# Patient Record
Sex: Male | Born: 1980 | Race: White | Hispanic: No | Marital: Single | Smoking: Never smoker
Health system: Southern US, Community
[De-identification: ages and names within clinical notes are randomized; demographics above are authoritative.]

## PROBLEM LIST (undated history)

## (undated) DIAGNOSIS — F410 Panic disorder [episodic paroxysmal anxiety] without agoraphobia: Secondary | ICD-10-CM

## (undated) DIAGNOSIS — F419 Anxiety disorder, unspecified: Secondary | ICD-10-CM

## (undated) DIAGNOSIS — F209 Schizophrenia, unspecified: Secondary | ICD-10-CM

## (undated) DIAGNOSIS — F1910 Other psychoactive substance abuse, uncomplicated: Secondary | ICD-10-CM

## (undated) DIAGNOSIS — F1994 Other psychoactive substance use, unspecified with psychoactive substance-induced mood disorder: Secondary | ICD-10-CM

## (undated) HISTORY — PX: TONSILLECTOMY: SUR1361

---

## 2005-06-17 ENCOUNTER — Emergency Department (HOSPITAL_COMMUNITY): Admission: EM | Admit: 2005-06-17 | Discharge: 2005-06-17 | Payer: Self-pay | Admitting: Emergency Medicine

## 2005-08-31 ENCOUNTER — Emergency Department (HOSPITAL_COMMUNITY): Admission: EM | Admit: 2005-08-31 | Discharge: 2005-08-31 | Payer: Self-pay | Admitting: Emergency Medicine

## 2005-09-08 ENCOUNTER — Emergency Department (HOSPITAL_COMMUNITY): Admission: EM | Admit: 2005-09-08 | Discharge: 2005-09-08 | Payer: Self-pay | Admitting: Emergency Medicine

## 2005-12-31 ENCOUNTER — Emergency Department (HOSPITAL_COMMUNITY): Admission: EM | Admit: 2005-12-31 | Discharge: 2005-12-31 | Payer: Self-pay | Admitting: Emergency Medicine

## 2006-05-23 ENCOUNTER — Emergency Department (HOSPITAL_COMMUNITY): Admission: EM | Admit: 2006-05-23 | Discharge: 2006-05-23 | Payer: Self-pay | Admitting: Emergency Medicine

## 2014-11-10 ENCOUNTER — Encounter (HOSPITAL_COMMUNITY): Payer: Self-pay | Admitting: Emergency Medicine

## 2014-11-10 ENCOUNTER — Emergency Department (HOSPITAL_COMMUNITY): Payer: Self-pay

## 2014-11-10 ENCOUNTER — Encounter (HOSPITAL_COMMUNITY): Payer: Self-pay

## 2014-11-10 ENCOUNTER — Emergency Department (HOSPITAL_COMMUNITY)
Admission: EM | Admit: 2014-11-10 | Discharge: 2014-11-12 | Disposition: A | Discharge status: Home / Self Care | Payer: Self-pay | Attending: Emergency Medicine | Admitting: Emergency Medicine

## 2014-11-10 ENCOUNTER — Emergency Department (HOSPITAL_COMMUNITY)
Admission: EM | Admit: 2014-11-10 | Discharge: 2014-11-10 | Disposition: A | Discharge status: Home / Self Care | Payer: Self-pay | Attending: Emergency Medicine | Admitting: Emergency Medicine

## 2014-11-10 DIAGNOSIS — F22 Delusional disorders: Secondary | ICD-10-CM | POA: Insufficient documentation

## 2014-11-10 DIAGNOSIS — F1410 Cocaine abuse, uncomplicated: Secondary | ICD-10-CM | POA: Insufficient documentation

## 2014-11-10 DIAGNOSIS — R21 Rash and other nonspecific skin eruption: Secondary | ICD-10-CM | POA: Insufficient documentation

## 2014-11-10 DIAGNOSIS — F1994 Other psychoactive substance use, unspecified with psychoactive substance-induced mood disorder: Secondary | ICD-10-CM | POA: Diagnosis present

## 2014-11-10 DIAGNOSIS — S0990XA Unspecified injury of head, initial encounter: Secondary | ICD-10-CM | POA: Insufficient documentation

## 2014-11-10 DIAGNOSIS — R000 Tachycardia, unspecified: Secondary | ICD-10-CM | POA: Insufficient documentation

## 2014-11-10 DIAGNOSIS — Y998 Other external cause status: Secondary | ICD-10-CM | POA: Insufficient documentation

## 2014-11-10 DIAGNOSIS — F1510 Other stimulant abuse, uncomplicated: Secondary | ICD-10-CM | POA: Insufficient documentation

## 2014-11-10 DIAGNOSIS — Z872 Personal history of diseases of the skin and subcutaneous tissue: Secondary | ICD-10-CM | POA: Insufficient documentation

## 2014-11-10 DIAGNOSIS — Y9389 Activity, other specified: Secondary | ICD-10-CM | POA: Insufficient documentation

## 2014-11-10 DIAGNOSIS — Y9289 Other specified places as the place of occurrence of the external cause: Secondary | ICD-10-CM | POA: Insufficient documentation

## 2014-11-10 DIAGNOSIS — F419 Anxiety disorder, unspecified: Secondary | ICD-10-CM | POA: Insufficient documentation

## 2014-11-10 DIAGNOSIS — L259 Unspecified contact dermatitis, unspecified cause: Secondary | ICD-10-CM | POA: Insufficient documentation

## 2014-11-10 DIAGNOSIS — X838XXA Intentional self-harm by other specified means, initial encounter: Secondary | ICD-10-CM | POA: Insufficient documentation

## 2014-11-10 DIAGNOSIS — F1210 Cannabis abuse, uncomplicated: Secondary | ICD-10-CM | POA: Insufficient documentation

## 2014-11-10 DIAGNOSIS — F1910 Other psychoactive substance abuse, uncomplicated: Secondary | ICD-10-CM

## 2014-11-10 LAB — COMPREHENSIVE METABOLIC PANEL
ALT: 28 U/L (ref 17–63)
ANION GAP: 11 (ref 5–15)
AST: 37 U/L (ref 15–41)
Albumin: 5.1 g/dL — ABNORMAL HIGH (ref 3.5–5.0)
Alkaline Phosphatase: 50 U/L (ref 38–126)
BUN: 18 mg/dL (ref 6–20)
CALCIUM: 9.9 mg/dL (ref 8.9–10.3)
CO2: 25 mmol/L (ref 22–32)
CREATININE: 1.14 mg/dL (ref 0.61–1.24)
Chloride: 101 mmol/L (ref 101–111)
GFR calc non Af Amer: >60 mL/min (ref >60)
GLUCOSE: 113 mg/dL — AB (ref 70–99)
Potassium: 3.5 mmol/L (ref 3.5–5.1)
SODIUM: 137 mmol/L (ref 135–145)
TOTAL PROTEIN: 8.8 g/dL — AB (ref 6.5–8.1)
Total Bilirubin: 0.9 mg/dL (ref 0.3–1.2)

## 2014-11-10 LAB — CBC WITH DIFFERENTIAL/PLATELET
BASOS PCT: 1 % (ref 0–1)
Basophils Absolute: 0 10*3/uL (ref 0.0–0.1)
EOS ABS: 0.2 10*3/uL (ref 0.0–0.7)
Eosinophils Relative: 2 % (ref 0–5)
HEMATOCRIT: 46.9 % (ref 39.0–52.0)
HEMOGLOBIN: 15.8 g/dL (ref 13.0–17.0)
LYMPHS ABS: 1.6 10*3/uL (ref 0.7–4.0)
Lymphocytes Relative: 22 % (ref 12–46)
MCH: 32.8 pg (ref 26.0–34.0)
MCHC: 33.7 g/dL (ref 30.0–36.0)
MCV: 97.3 fL (ref 78.0–100.0)
MONO ABS: 0.5 10*3/uL (ref 0.1–1.0)
MONOS PCT: 7 % (ref 3–12)
NEUTROS ABS: 5 10*3/uL (ref 1.7–7.7)
Neutrophils Relative %: 68 % (ref 43–77)
Platelets: 273 10*3/uL (ref 150–400)
RBC: 4.82 MIL/uL (ref 4.22–5.81)
RDW: 12.3 % (ref 11.5–15.5)
WBC: 7.3 10*3/uL (ref 4.0–10.5)

## 2014-11-10 LAB — RAPID URINE DRUG SCREEN, HOSP PERFORMED
Amphetamines: POSITIVE — AB
BENZODIAZEPINES: NOT DETECTED
Barbiturates: NOT DETECTED
COCAINE: POSITIVE — AB
OPIATES: NOT DETECTED
Tetrahydrocannabinol: POSITIVE — AB

## 2014-11-10 LAB — ETHANOL: Alcohol, Ethyl (B): <5 mg/dL (ref <5)

## 2014-11-10 LAB — ACETAMINOPHEN LEVEL: Acetaminophen (Tylenol), Serum: <10 ug/mL — ABNORMAL LOW (ref 10–30)

## 2014-11-10 LAB — SALICYLATE LEVEL: Salicylate Lvl: <4 mg/dL (ref 2.8–30.0)

## 2014-11-10 MED ORDER — ONDANSETRON HCL 4 MG PO TABS: 4.0000 mg | ORAL_TABLET | Freq: Three times a day (TID) | ORAL | Status: DC | PRN

## 2014-11-10 MED ORDER — ZOLPIDEM TARTRATE 5 MG PO TABS
5.0000 mg | ORAL_TABLET | Freq: Every evening | ORAL | Status: DC | PRN
  Administered 2014-11-11: 5 mg via ORAL
  Filled 2014-11-10 (×2): qty 1

## 2014-11-10 MED ORDER — DIPHENHYDRAMINE HCL 25 MG PO CAPS
25.0000 mg | ORAL_CAPSULE | Freq: Four times a day (QID) | ORAL | Status: DC
Start: 2014-11-10 — End: 2014-11-12
  Administered 2014-11-10 – 2014-11-12 (×3): 25 mg via ORAL
  Filled 2014-11-10 (×7): qty 1

## 2014-11-10 MED ORDER — ZIPRASIDONE MESYLATE 20 MG IM SOLR
10.0000 mg | Freq: Once | INTRAMUSCULAR | Status: AC
  Administered 2014-11-11: 10 mg via INTRAMUSCULAR
  Filled 2014-11-10: qty 20

## 2014-11-10 MED ORDER — IBUPROFEN 200 MG PO TABS: 600.0000 mg | ORAL_TABLET | Freq: Three times a day (TID) | ORAL | Status: DC | PRN

## 2014-11-10 MED ORDER — ACETAMINOPHEN 325 MG PO TABS
650.0000 mg | ORAL_TABLET | ORAL | Status: DC | PRN
  Filled 2014-11-10: qty 2

## 2014-11-10 MED ORDER — ALUM & MAG HYDROXIDE-SIMETH 200-200-20 MG/5ML PO SUSP: 30.0000 mL | ORAL | Status: DC | PRN

## 2014-11-10 MED ORDER — DIPHENHYDRAMINE HCL 25 MG PO TABS: 25.0000 mg | ORAL_TABLET | Freq: Four times a day (QID) | ORAL | Status: DC

## 2014-11-10 MED ORDER — LORAZEPAM 1 MG PO TABS
1.0000 mg | ORAL_TABLET | Freq: Once | ORAL | Status: DC
  Filled 2014-11-10: qty 1

## 2014-11-10 MED ORDER — HYDROCORTISONE 1 % EX CREA
TOPICAL_CREAM | Freq: Two times a day (BID) | CUTANEOUS | Status: DC
  Administered 2014-11-11: 21:00:00 via TOPICAL
  Filled 2014-11-10: qty 28

## 2014-11-10 MED ORDER — HYDROCORTISONE 1 % EX CREA: TOPICAL_CREAM | CUTANEOUS | Status: DC

## 2014-11-10 MED ORDER — LORAZEPAM 2 MG/ML IJ SOLN
2.0000 mg | Freq: Once | INTRAMUSCULAR | Status: AC
  Administered 2014-11-11: 2 mg via INTRAMUSCULAR
  Filled 2014-11-10: qty 1

## 2014-11-10 MED ORDER — LORAZEPAM 2 MG/ML IJ SOLN: 1.0000 mg | Freq: Once | INTRAMUSCULAR | Status: DC

## 2014-11-10 MED ORDER — LORAZEPAM 1 MG PO TABS
1.0000 mg | ORAL_TABLET | Freq: Three times a day (TID) | ORAL | Status: DC | PRN
  Administered 2014-11-11: 1 mg via ORAL
  Filled 2014-11-10 (×2): qty 1
  Filled 2014-11-10: qty 2

## 2014-11-10 NOTE — ED Notes (Signed)
Patient was witnessed by several nursing staff members of him banging his head on the mantle piece by nursing station and wall. Nurse Aundra MilletMegan, [employer] are in the room with patient now and he has a ice pack on his forehead, and Harriett Sineancy for TTS is also in room

## 2014-11-10 NOTE — BH Assessment (Signed)
Informed by Jacques Navy, that pt is reports SI and HI, and increasing anxiety yet refusing medication. Met with pt to discuss his concerns, and asked him to return to his room as he was expressing thoughts of hurting others. Pt reports he can not return to his room because "there are too many ways I can hurt myself in there." Pt reports he will decline medication. Megan RN informed.    Lear Ng, [facility] Triage Specialist 11/10/2014 9:58 PM

## 2014-11-10 NOTE — ED Notes (Signed)
Patient dressed out.  Security called to wand patient and belongings.

## 2014-11-10 NOTE — ED Notes (Signed)
Patient reports increasing anxiety and feelings of SI. Patient continues to decline offer of medication. Tried to speak with the patient. He reports racing thoughts.  Spoke with Harriett SineNancy, TTS. She will speak with the patient again.  Q 15 safety checks continue.

## 2014-11-10 NOTE — ED Notes (Signed)
Pt states he woke up this morning with an itching rash this morning on his arms that's begun spreading over his body. States it's made him feel very weak and drained.

## 2014-11-10 NOTE — ED Notes (Signed)
[id number] Expected date:  Expected time:  Means of arrival:  Comments: Hold for Shelva MajesticMutz, M

## 2014-11-10 NOTE — ED Notes (Signed)
Patient back in the main ED requesting to speak with [employer].  [employer] notified and talking with patient.

## 2014-11-10 NOTE — ED Notes (Signed)
TTS at bedside. 

## 2014-11-10 NOTE — ED Provider Notes (Signed)
CSN: [id number]     Arrival date & time 11/10/14  1750 History   First MD Initiated Contact with Patient 11/10/14 1843     Chief Complaint  Patient presents with  . Anxiety  . Paranoid     (Consider location/radiation/quality/duration/timing/severity/associated sxs/prior Treatment) HPI Comments: Felecia JanMark Seoane is a 34 y.o. male with a PMHx of ADHD, who presents to the ED with complaints of  sudden onset anxiety and paranoia beginning yesterday.  he states that he feels like he is being followed and talked about , reporting that [facility] he heard people conversing amongst themselves and he felt that they were talking about him. He states there've not been any recent stressors in his life  that could have caused these symptoms, but admits that he tried methamphetamine for the first time one week ago. States he has a positive family history of schizophrenia, bipolar, and major depressive disorder in his father side of the family and his uncles.  aside from ADHD he has no other medical or mental health diagnoses , and he is not currently treated for his ADHD. He is a social drinker, last drink was last week. He denies any auditory or visual hallucinations, suicidal ideations or homicidal ideations. Denies any medical complaints including fevers or chills, chest pain, shortness breath, abdominal pain, nausea, vomiting, diarrhea, dysuria, hematuria, arthralgias, myalgias, numbness, tingling, or weakness. He denies any headache or vision changes. He reports that he has a rash which he had are a been seen for earlier today. He states that he does not feel safe going home due to this anxiety and paranoia.   Patient is a 34 y.o. male presenting with anxiety. The history is provided by the patient. No language interpreter was used.  Anxiety This is a new problem. The current episode started yesterday. The problem occurs constantly. The problem has been unchanged. Associated symptoms  include a rash (seen earlier for dermatitis). Pertinent negatives include no abdominal pain, arthralgias, chest pain, chills, fever, headaches, myalgias, nausea, numbness, visual change, vomiting or weakness. Nothing aggravates the symptoms. He has tried nothing for the symptoms. The treatment provided no relief.    History reviewed. No pertinent past medical history. Past Surgical History  Procedure Laterality Date  . Tonsillectomy     History reviewed. No pertinent family history. History  Substance Use Topics  . Smoking status: Never Smoker   . Smokeless tobacco: Not on file  . Alcohol Use: Yes     Comment: social    Review of Systems  Constitutional: Negative for fever and chills.  Eyes: Negative for visual disturbance.  Respiratory: Negative for shortness of breath.   Cardiovascular: Negative for chest pain.  Gastrointestinal: Negative for nausea, vomiting, abdominal pain, diarrhea and constipation.  Genitourinary: Negative for dysuria and hematuria.  Musculoskeletal: Negative for myalgias and arthralgias.  Skin: Positive for rash (seen earlier for dermatitis).  Allergic/Immunologic: Negative for immunocompromised state.  Neurological: Negative for weakness, numbness and headaches.  Psychiatric/Behavioral: Negative for suicidal ideas, hallucinations and confusion. The patient is nervous/anxious.    10 Systems reviewed and are negative for acute change except as noted in the HPI.    Allergies  Review of patient's allergies indicates no known allergies.  Home Medications   Prior to Admission medications   Medication Sig Start Date End Date Taking? Authorizing Provider  diphenhydrAMINE (BENADRYL) 25 MG tablet Take 1 tablet (25 mg total) by mouth every 6 (six) hours. Patient not taking: Reported on  11/10/2014 11/10/14   Kathrynn Speedobyn M Hess, PA-C  hydrocortisone cream 1 % Apply to affected area 2 times daily Patient not taking: Reported on 11/10/2014 11/10/14   Robyn M Hess, PA-C    BP 150/94 mmHg  Pulse 107  Temp(Src) 98.3 F (36.8 C) (Oral)  Resp 18  SpO2 100% Physical Exam  Constitutional: He is oriented to person, place, and time. He appears well-developed and well-nourished.  Non-toxic appearance. He appears distressed (appears anxious).  Afebrile, nontoxic, appears anxious, mildly tachycardic  HENT:  Head: Normocephalic and atraumatic.  Mouth/Throat: Oropharynx is clear and moist and mucous membranes are normal.  Eyes: Conjunctivae and EOM are normal. Right eye exhibits no discharge. Left eye exhibits no discharge.  Neck: Normal range of motion. Neck supple.  Cardiovascular: Regular rhythm, normal heart sounds and intact distal pulses.  Tachycardia present.  Exam reveals no gallop and no friction rub.   No murmur heard. Mildly tachycardic  Pulmonary/Chest: Effort normal and breath sounds normal. No respiratory distress. He has no decreased breath sounds. He has no wheezes. He has no rhonchi. He has no rales.  Abdominal: Soft. Normal appearance and bowel sounds are normal. He exhibits no distension. There is no tenderness. There is no rigidity, no rebound, no guarding, no CVA tenderness, no tenderness at McBurney's point and negative Murphy's sign.  Musculoskeletal: Normal range of motion.  Neurological: He is alert and oriented to person, place, and time. He has normal strength. No sensory deficit.  Skin: Skin is warm, dry and intact. No rash noted.  Psychiatric: His mood appears anxious. He is not actively hallucinating. Thought content is paranoid. Thought content is not delusional. He expresses no homicidal and no suicidal ideation. He expresses no suicidal plans and no homicidal plans.  Appears anxious, endorsing paranoid thoughts. No AVH, no SI/HI. Speech slightly rapid but goal-oriented speech.   Nursing note and vitals reviewed.   ED Course  Procedures (including critical care time) Labs Review Labs Reviewed  COMPREHENSIVE METABOLIC PANEL -  Abnormal; Notable for the following:    Glucose, Bld 113 (*)    Total Protein 8.8 (*)    Albumin 5.1 (*)    All other components within normal limits  URINE RAPID DRUG SCREEN (HOSP PERFORMED) - Abnormal; Notable for the following:    Cocaine POSITIVE (*)    Amphetamines POSITIVE (*)    Tetrahydrocannabinol POSITIVE (*)    All other components within normal limits  ACETAMINOPHEN LEVEL - Abnormal; Notable for the following:    Acetaminophen (Tylenol), Serum <10 (*)    All other components within normal limits  CBC WITH DIFFERENTIAL/PLATELET  ETHANOL  SALICYLATE LEVEL    Imaging Review No results found.   EKG Interpretation None      MDM   Final diagnoses:  Paranoia  Anxiety  Cocaine abuse  Polysubstance abuse  Head injury    34 y.o. male  here with acute onset anxiety and paranoia after trying methamphetamine last week. He does not drink chronically last drink was last week, no SI/HI/AVH feels that he is being followed and that people are talking about him when he hears people talking around him. D[facility] with [facility]ristin [facility] who feels he warrants formal TTS consult and psych input given his strong FHx of schizophrenia and bipolar. Will get screening labs and consult TTS.   7:57 PM Pt very anxious feeling up front in the triage area. Agrees that if he can go back to the back he'll get labs. Working on getting  pt wanded and changed out, then will get labs to medically clear. Will get TTS consulted now despite not yet having labs. Will give ativan now.  9:09 PM TTS calling me, states that his UDS came back with +cocaine, +amphetamines, +THC, therefore they want to keep him until the morning and have psych reassess him. Pt stable at this time. CBC unremarkable, still awaiting CMP, EtOH, Tylenol level, and salicylate levels.   9:42 PM CMP unremarkable, EtOH level neg, Acetaminophen and salicylate WNL. Pt is medically cleared. Please see [facility]/Psych notes for further documentation  of care. Holding orders placed, home meds ordered.  Taylorann Tkach Camprubi-Soms, PA-C 11/10/14 2143  Addendum 10:24 PM: called to re-evaluate pt, he got up out of bed and banged his head on the hard corner surface of bed. Now having dizziness and headache. Neuro exam nonfocal. Will get CT head. Will IVC pt since he's now a threat to himself. He agrees to take his meds. Then I was told he wasn't taking it after I left, therefore will give geodon and ativan IM. IVC paperwork filled out.   12:48 AM CT still not yet performed, CT aware of his order, stated they are waiting for nursing to tell them he's ready for CT. Will ask nursing staff to get him ready for this. Will sign out care to Elpidio Anis PA-C at shift change to follow up on his CT results. Plan is if no acute injury from banging his head on bed, no further work up needed acutely and will proceed with waiting on psych input in the morning. If any acute bleed/injury, consult neuro as appropriate. Please see her documentation for further notation about care/results.  Janesa Dockery Camprubi-Soms, PA-C 11/11/14 0050  1:09 AM CT neg.   Joeli Fenner Camprubi-Soms, PA-C 11/11/14 0109  Blane Ohara, MD 11/13/14 1106

## 2014-11-10 NOTE — ED Notes (Signed)
Writer called PA to notified that pt would like to speak to her before allowing staff members to draw blood

## 2014-11-10 NOTE — Discharge Instructions (Signed)
Apply hydrocortisone cream twice daily as prescribed. Take Benadryl as directed for itching.  Contact Dermatitis Contact dermatitis is a reaction to certain substances that touch the skin. Contact dermatitis can be either irritant contact dermatitis or allergic contact dermatitis. Irritant contact dermatitis does not require previous exposure to the substance for a reaction to occur.Allergic contact dermatitis only occurs if you have been exposed to the substance before. Upon a repeat exposure, your body reacts to the substance.  CAUSES  Many substances can cause contact dermatitis. Irritant dermatitis is most commonly caused by repeated exposure to mildly irritating substances, such as:  Makeup.  Soaps.  Detergents.  Bleaches.  Acids.  Metal salts, such as nickel. Allergic contact dermatitis is most commonly caused by exposure to:  Poisonous plants.  Chemicals (deodorants, shampoos).  Jewelry.  Latex.  Neomycin in triple antibiotic cream.  Preservatives in products, including clothing. SYMPTOMS  The area of skin that is exposed may develop:  Dryness or flaking.  Redness.  Cracks.  Itching.  Pain or a burning sensation.  Blisters. With allergic contact dermatitis, there may also be swelling in areas such as the eyelids, mouth, or genitals.  DIAGNOSIS  Your caregiver can usually tell what the problem is by doing a physical exam. In cases where the cause is uncertain and an allergic contact dermatitis is suspected, a patch skin test may be performed to help determine the cause of your dermatitis. TREATMENT Treatment includes protecting the skin from further contact with the irritating substance by avoiding that substance if possible. Barrier creams, powders, and gloves may be helpful. Your caregiver may also recommend:  Steroid creams or ointments applied 2 times daily. For best results, soak the rash area in cool water for 20 minutes. Then apply the medicine. Cover  the area with a plastic wrap. You can store the steroid cream in the refrigerator for a "chilly" effect on your rash. That may decrease itching. Oral steroid medicines may be needed in more severe cases.  Antibiotics or antibacterial ointments if a skin infection is present.  Antihistamine lotion or an antihistamine taken by mouth to ease itching.  Lubricants to keep moisture in your skin.  Burow's solution to reduce redness and soreness or to dry a weeping rash. Mix one packet or tablet of solution in 2 cups cool water. Dip a clean washcloth in the mixture, wring it out a bit, and put it on the affected area. Leave the cloth in place for 30 minutes. Do this as often as possible throughout the day.  Taking several cornstarch or baking soda baths daily if the area is too large to cover with a washcloth. Harsh chemicals, such as alkalis or acids, can cause skin damage that is like a burn. You should flush your skin for 15 to 20 minutes with cold water after such an exposure. You should also seek immediate medical care after exposure. Bandages (dressings), antibiotics, and pain medicine may be needed for severely irritated skin.  HOME CARE INSTRUCTIONS  Avoid the substance that caused your reaction.  Keep the area of skin that is affected away from hot water, soap, sunlight, chemicals, acidic substances, or anything else that would irritate your skin.  Do not scratch the rash. Scratching may cause the rash to become infected.  You may take cool baths to help stop the itching.  Only take over-the-counter or prescription medicines as directed by your caregiver.  See your caregiver for follow-up care as directed to make sure your skin is healing  properly. SEEK MEDICAL CARE IF:   Your condition is not better after 3 days of treatment.  You seem to be getting worse.  You see signs of infection such as swelling, tenderness, redness, soreness, or warmth in the affected area.  You have any  problems related to your medicines. Document Released: 06/16/2000 Document Revised: 09/11/2011 Document Reviewed: 11/22/2010 [facility] Patient Information 2015 [place], [place]. This information is not intended to replace advice given to you by your health care provider. Make sure you discuss any questions you have with your health care provider.  Rash A rash is a change in the color or texture of your skin. There are many different types of rashes. You may have other problems that accompany your rash. CAUSES   Infections.  Allergic reactions. This can include allergies to pets or foods.  Certain medicines.  Exposure to certain chemicals, soaps, or cosmetics.  Heat.  Exposure to poisonous plants.  Tumors, both cancerous and noncancerous. SYMPTOMS   Redness.  Scaly skin.  Itchy skin.  Dry or cracked skin.  Bumps.  Blisters.  Pain. DIAGNOSIS  Your caregiver may do a physical exam to determine what type of rash you have. A skin sample (biopsy) may be taken and examined under a microscope. TREATMENT  Treatment depends on the type of rash you have. Your caregiver may prescribe certain medicines. For serious conditions, you may need to see a skin doctor (dermatologist). HOME CARE INSTRUCTIONS   Avoid the substance that caused your rash.  Do not scratch your rash. This can cause infection.  You may take cool baths to help stop itching.  Only take over-the-counter or prescription medicines as directed by your caregiver.  Keep all follow-up appointments as directed by your caregiver. SEEK IMMEDIATE MEDICAL CARE IF:  You have increasing pain, swelling, or redness.  You have a fever.  You have new or severe symptoms.  You have body aches, diarrhea, or vomiting.  Your rash is not better after 3 days. MAKE SURE YOU:  Understand these instructions.  Will watch your condition.  Will get help right away if you are not doing well or get worse. Document Released:  06/09/2002 Document Revised: 09/11/2011 Document Reviewed: 04/03/2011 [facility] Patient Information 2015 [place], [place]. This information is not intended to replace advice given to you by your health care provider. Make sure you discuss any questions you have with your health care provider.

## 2014-11-10 NOTE — ED Notes (Signed)
Removed chair and bedside table from patient's room as he felt "there is too much in my room that I can hurt myself with".

## 2014-11-10 NOTE — BH Assessment (Signed)
Reviewed ED notes prior to initiating assessment. Per France RavensMercedes, PA-C note Pt here earlier for dermatitis. Pt returns stating that he did not say anything earlier b/c he didn't have the nerve. Pt states he feels paranoid. Feels people or watching him and that he constantly has to look around. Denies any hx of paranoia or psych hx. Pt does have ADHD. Lives in a rooming home. Pt states he feels unsafe there. Has no specific reason for paranoia. No new stressors in life. Pt denies SI/HI. Denies hearing voices or visual hallucinations. Pt asked to have room door left open and to check on him.   Assessment to commence shortly.    Clista BernhardtNancy Jeffery Bachmeier, [facility] Triage Specialist 11/10/2014 8:01 PM

## 2014-11-10 NOTE — BH Assessment (Addendum)
Tele Assessment Note  Blood work had not resulted at time of assessment.   Aaron Townsend is an 34 y.o. male. With reported hx of ADHD, but not on medication, arriving to ED due to increased paranoia and anxiety. Pt reports onset of these sx about 4-5 days ago, and "getting worse and worse" today. He reports he came to the hospital because he feels no one is helping him, and he needs to feel "protected" Pt reports he wants to be placed in seclusion, and guarded so he can be properly evaluated. At the time of assessment pt requested an officer stand in front of him to protect him, and pt remained very anxious, he had trouble answering questions as he stood in the doorway of his room, watching everything that took place, and appearing to become more and more anxious. Pt was alert, very anxious, oriented to person, place, and time, but reports he believes there is a conspiracy going on and that he can hear people threatening him. He denies SI, HI, and self-harm. He reports SI about 2-3 years ago when his grandfather passed away, but had no past plans or attempts.   Pt reports family history is positive for bipolar and schizophrenia, and is concerned that this may be happening to him, however, also believes that he is currently in danger. Pt reports he has never had these symptoms before, and has been having constant panic attacks.  Pt reports about two or three months ago he started to feel depressed. He reports he never really had episodes of depression before, however, mentioned SI and depression after loss of grandfather.Pt was unable to describe current depressive sx but noted losing 20 lbs in the last two months and being unable to sleep more the 2-4 hours. He reports depressive episode predated onset of anxiety. Denies SI. Denies hx of mania or hypomania.  Pt reports hx of infrequent panic attacks, and denies hx of OCD, phobia, or PTSD. Denies hx of abuse or trauma. He reports persistent panic and paranoia,  feelings that people are conspiring against him.  Pt reports he began using etoh at age 59 and drinks about 2-3 per month, about 1 beer. "Sometimes I have a good time, but I never take it too far." Pt reports last drink was about 10 days ago. Pt reports about monthly use of THC since age 52, with last use about 3 weeks ago. Pt reports he sometimes uses cocaine but was unable to supply details. He reports last use was several months ago. Pt reports using pain medications starting at age 29 but no use in 4-5 years. Pt reports one time use of methamphetamines about a week ago. Pt tested positive to cocaine, TCH, and Amphetamines.  Pt indicated he was dx with ADHD at age 73 but is not currently being treated for this.   Pt has no prior hx of inpt or OP mental health treatment. He reports he gets along with his roommates, denies trouble at work, or onset of new stressors. He lists his brother as a good support for him. He denies any medical concerns at this time, but was seen in ED in the last day or so for dermatitis.   UPDATE: After assessment and pt moved to secure area he began to expression SI, and HI. He was banging his head and trying to hurt himself. He was placed under IVC and had to be given medication IM.   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 No pertinent past medical history.  Past Surgical History  Procedure Laterality Date  . Tonsillectomy      Family History: History reviewed. No pertinent family history.  Social History:  reports that he has never smoked. He does not have any smokeless tobacco history on file. He reports that he drinks alcohol. He reports that he does not use illicit  drugs.  Additional Social History:  Alcohol / Drug Use Pain Medications: See PTA, denies current abuse reports last use about 4-5 years ago Prescriptions: Denies, See PTA Over the Counter: See PTA History of alcohol / drug use?: Yes Longest period of sobriety (when/how long): 4-5 years for pain medications, months etoh  Negative Consequences of Use:  (none reported) Withdrawal Symptoms:  (none reported, however, very paranoid) Substance #1 Name of Substance 1: methamphetamines 1 - Age of First Use: 33 about a week and a half ago 1 - Amount (size/oz): uncertain 1 - Frequency: one time use reported 1 - Duration: one time use reported 1 - Last Use / Amount: 1.5 weeks ago Substance #2 Name of Substance 2: etoh 2 - Age of First Use: 20 2 - Amount (size/oz): 1-5 drinks, usually one or two 2 - Frequency: about once per month 2 - Duration: years at this level  2 - Last Use / Amount: abotu 1.5 weeks ago a beer Substance #3 Name of Substance 3: THC 3 - Age of First Use: 26 3 - Amount (size/oz): "a little bit" 3 - Frequency: about once a month 3 - Duration: about 7 years 3 - Last Use / Amount: 2.5 to three weeks ago  Substance #4 Name of Substance 4: cocaine 4 - Age of First Use: 28 4 - Amount (size/oz): unable to provide details due to paranoia, distracted and requested officer stand in front of him  4 - Frequency: unable to provide details due to paranoia, distracted and requested officer stand in front of him  4 - Duration: unable to provide details due to paranoia, distracted and requested officer stand in front of him  4 - Last Use / Amount: a few months Substance #5 Name of Substance 5: pain medications 5 - Age of First Use: 21 5 - Amount (size/oz): unable to provide details due to paranoia, distracted and requested officer stand in front of him  5 - Frequency: unable to provide details due to paranoia, distracted and requested officer stand in front of him  5 - Duration: unable  to provide details due to paranoia, distracted and requested officer stand in front of him  5 - Last Use / Amount: 4-5 years ago  CIWA: CIWA-Ar BP: 150/94 mmHg Pulse Rate: 107 COWS:    PATIENT STRENGTHS: (choose at least two) Average or above average intelligence Communication skills Supportive family/friends Work skills  Allergies: No Known Allergies  Home Medications:  (Not in a hospital admission)  OB/GYN Status:  No LMP for male patient.  General Assessment Data Location of Assessment: [facility] TTS Assessment: In system Is this a Tele or Face-to-Face Assessment?: Face-to-Face Is this an Initial Assessment or a Re-assessment for this encounter?: Initial Assessment Marital status: Single Is patient pregnant?: No Pregnancy Status: No Living Arrangements: Non-relatives/Friends Can pt return to current living arrangement?: Yes Admission Status: Voluntary Is patient capable of signing voluntary  admission?: Yes Referral Source: Self/Family/Friend Insurance type: none     Crisis Care Plan Living Arrangements: Non-relatives/Friends Name of Psychiatrist: none Name of Therapist: none  Education Status Is patient currently in school?: No Current Grade: NA Highest grade of school patient has completed: 12 Name of school: NA Contact person: NA  Risk to self with the past 6 months Suicidal Ideation: No Has patient been a risk to self within the past 6 months prior to admission? : No Suicidal Intent: No Has patient had any suicidal intent within the past 6 months prior to admission? : No Is patient at risk for suicide?: No Suicidal Plan?: No Has patient had any suicidal plan within the past 6 months prior to admission? : No Access to Means: No What has been your use of drugs/alcohol within the last 12 months?: Pt reports first use of methamphetamines about a week ago, one time use. He reports infrequent use of cocaine, details unknow, and about monthly use of etoh and  THC Previous Attempts/Gestures: No How many times?: 0 Other Self Harm Risks: none Triggers for Past Attempts: None known Intentional Self Injurious Behavior: None Family Suicide History: No Recent stressful life event(s): Other (Comment) (recent onset of new MH sx) Persecutory voices/beliefs?: Yes Depression: Yes Depression Symptoms: Insomnia, Isolating, Loss of interest in usual pleasures, Fatigue, Despondent Substance abuse history and/or treatment for substance abuse?: No Suicide prevention information given to non-admitted patients: Not applicable  Risk to Others within the past 6 months Homicidal Ideation: No Does patient have any lifetime risk of violence toward others beyond the six months prior to admission? : No Thoughts of Harm to Others: No Current Homicidal Intent: No Current Homicidal Plan: No Access to Homicidal Means: No Identified Victim: none History of harm to others?: No Assessment of Violence: None Noted Violent Behavior Description: none Does patient have access to weapons?: No Criminal Charges Pending?: No Does patient have a court date: No Is patient on probation?: No  Psychosis Hallucinations: Auditory (making threats) Delusions: Persecutory  Mental Status Report Appearance/Hygiene: In scrubs Eye Contact: Poor Motor Activity: Restlessness Speech: Logical/coherent Level of Consciousness: Alert Mood: Anxious, Fearful, Preoccupied Affect: Apprehensive Anxiety Level: Panic Attacks Panic attack frequency: ongoing for past 4-5 days, in the past very rarely per pt Most recent panic attack: today  Thought Processes:  (influenced by delusions) Judgement: Impaired Orientation: Person, Place, Time (appears to be delusional about situation) Obsessive Compulsive Thoughts/Behaviors: None  Cognitive Functioning Concentration: Decreased Memory: Recent Intact, Remote Intact IQ: Average Insight: Poor Impulse Control: Fair Appetite: Poor Weight Loss: 20  (in two months) Weight Gain: 0 Sleep: Decreased Total Hours of Sleep: 3 (2-4 hours per pt ) Vegetative Symptoms: None  ADLScreening [facility] Assessment [facility]) Patient's cognitive ability adequate to safely complete daily activities?: Yes Patient able to express need for assistance with ADLs?: Yes Independently performs ADLs?: Yes (appropriate for developmental age)  Prior Inpatient Therapy Prior Inpatient Therapy: No Prior Therapy Dates: NA Prior Therapy Facilty/Provider(s): NA Reason for Treatment: NA  Prior Outpatient Therapy Prior Outpatient Therapy: No Prior Therapy Dates: NA Prior Therapy Facilty/Provider(s): NA Reason for Treatment: NA Does patient have an ACCT team?: No Does patient have Intensive In-House Services?  : No Does patient have [facility] services? : No Does patient have P4CC services?: No  ADL Screening (condition at time of admission) Patient's cognitive ability adequate to safely complete daily activities?: Yes Is the patient deaf or have difficulty hearing?: No Does the patient have difficulty seeing, even when wearing  glasses/contacts?: No Does the patient have difficulty concentrating, remembering, or making decisions?: Yes Patient able to express need for assistance with ADLs?: Yes Does the patient have difficulty dressing or bathing?: No Independently performs ADLs?: Yes (appropriate for developmental age) Does the patient have difficulty walking or climbing stairs?: No Weakness of Legs: None Weakness of Arms/Hands: None  Home Assistive Devices/Equipment Home Assistive Devices/Equipment: None    Abuse/Neglect Assessment (Assessment to be complete while patient is alone) Physical Abuse: Denies Verbal Abuse: Denies Sexual Abuse: Denies Exploitation of patient/patient's resources: Denies Self-Neglect: Denies Values / Beliefs Cultural Requests During Hospitalization: None Spiritual Requests During Hospitalization: None   Advance Directives (For  Healthcare) Does patient have an advance directive?: No Would patient like information on creating an advanced directive?: No - patient declined information    Additional Information 1:1 In Past 12 Months?: No CIRT Risk: No Elopement Risk: No Does patient have medical clearance?: No     Disposition:  Per Donell SievertSpencer Simon, PA pt needs an AM psych eval for final disposition.  Informed UGI CorporationMercedes PA-C, and she is in agreement. Informed RN and pt of disposition.        Clista BernhardtNancy Tanylah Schnoebelen, [facility] Triage Specialist 11/10/2014 8:52 PM  Disposition Initial Assessment Completed for this Encounter: Yes  Warner Laduca M 11/10/2014 8:37 PM

## 2014-11-10 NOTE — ED Notes (Signed)
Pt here earlier for dermatitis.  Pt returns stating that he did not say anything earlier b/c he didn't have the nerve.  Pt states he feels paranoid. Feels people or watching him and that he constantly has to look around.  Denies any hx of paranoia or psych hx.  Pt does have ADHD.  Lives in a rooming home.  Pt states he feels unsafe there.  Has no specific reason for paranoia. No new stressors in life.  Pt denies SI/HI.  Denies hearing voices or visual hallucinations.  Pt asked to have room door left open and to check on him

## 2014-11-10 NOTE — ED Notes (Signed)
Labs delayed because pt wants to speak to the PA

## 2014-11-10 NOTE — ED Notes (Signed)
Unable to collect patient labs at this time.  He would like to speak to PA first.

## 2014-11-10 NOTE — ED Notes (Signed)
Patient anxious, paranoid. Reports SI and AH. States "I don't know" when asked about feelings of HI. Patient does not want to take Ativan or Ambien at present as he is fearful of becoming sleepy. States that he feels safe with an Technical sales engineerofficer at bedside.   Given Benadryl. Officer with patient at present.  Q 15 safety checks in place.

## 2014-11-10 NOTE — ED Provider Notes (Signed)
CSN: [id number]     Arrival date & time 11/10/14  1320 History  This chart was scribed for non-physician practitioner, Celene Skeenobyn [employer], PA-C,working with Aaron BarretteMarcy Pfeiffer, MD, by Aaron Townsend, ED Scribe. This patient was seen in room WTR6/WTR6 and the patient's care was started at 2:18 PM.  Chief Complaint  Patient presents with  . Rash   Patient is a 34 y.o. male presenting with rash. The history is provided by the patient and medical records. No language interpreter was used.  Rash Associated symptoms: no fever, no nausea, no shortness of breath and not vomiting     HPI Comments:  Aaron Townsend is a 34 y.o. male who presents to the Emergency Department complaining of an itching rash that began this morning upon waking. He states the rash now seems to be spreading all over his body to his back and ankles. He denies any new soaps, detergents, lotions, creams or any other personal hygiene products. He denies sleeping in a hotel, moving recently or being outside at all. He is unaware of anything that may have bitten him. Denies contact with anyone with similar symptoms.  Denies having any pets. He has not done anything to treat the rash. He denies modifying factors. Denies SOB, difficulty swallowing, fever, chills, nausea or vomiting.   History reviewed. No pertinent past medical history. Past Surgical History  Procedure Laterality Date  . Tonsillectomy     History reviewed. No pertinent family history. History  Substance Use Topics  . Smoking status: Not on file  . Smokeless tobacco: Not on file  . Alcohol Use: Not on file    Review of Systems  Constitutional: Negative for fever and chills.  HENT: Negative for trouble swallowing.   Respiratory: Negative for shortness of breath.   Gastrointestinal: Negative for nausea and vomiting.  Skin: Positive for rash.    Allergies  Review of patient's allergies indicates no known allergies.  Home Medications   Prior to Admission medications    Medication Sig Start Date End Date Taking? Authorizing Provider  diphenhydrAMINE (BENADRYL) 25 MG tablet Take 1 tablet (25 mg total) by mouth every 6 (six) hours. 11/10/14   Aaron Speedobyn M Jalesia Loudenslager, PA-C  hydrocortisone cream 1 % Apply to affected area 2 times daily 11/10/14   Aaron Speedobyn M Sheilah Rayos, PA-C   Triage Vitals: BP 132/86 mmHg  Pulse 102  Temp(Src) 98.5 F (36.9 C) (Oral)  Resp 20  SpO2 95% Physical Exam  Constitutional: He is oriented to person, place, and time. He appears well-developed and well-nourished. No distress.  HENT:  Head: Normocephalic and atraumatic.  Eyes: Conjunctivae and EOM are normal.  Neck: Normal range of motion. Neck supple.  Cardiovascular: Normal rate, regular rhythm and normal heart sounds.   Pulmonary/Chest: Effort normal and breath sounds normal.  Musculoskeletal: Normal range of motion. He exhibits no edema.  Neurological: He is alert and oriented to person, place, and time.  Skin: Skin is warm and dry. Rash noted.  Maculopapular rash on right upper arm. No secondary infection. Scattered areas of maculopapular rash on bilateral forearms, lower legs and neck. No secondary infection.  Psychiatric: He has a normal mood and affect. His behavior is normal.  Nursing note and vitals reviewed.   ED Course  Procedures (including critical care time) DIAGNOSTIC STUDIES: Oxygen Saturation is 95% on RA, adequate by my interpretation.   COORDINATION OF CARE: 2:21 PM- Will prescribe Hydrocortisone Cream. Pt verbalizes understanding and agrees to plan.  Medications - No data to display  Labs Review Labs Reviewed - No data to display  Imaging Review No results found.   EKG Interpretation None      MDM   Final diagnoses:  Contact dermatitis  Rash   Nontoxic appearing, NAD. AF VSS. No tachycardia on my exam. No respiratory or airway compromise. Rashes appearance of contact dermatitis. No secondary infection. Treat with hydrocortisone cream and Benadryl. Stable for  discharge. Return precautions given. Resources given for f/u. Patient states understanding of treatment care plan and is agreeable.  I personally performed the services described in this documentation, which was scribed in my presence. The recorded information has been reviewed and is accurate.    Aaron SpeedRobyn M Bolivar Koranda, PA-C 11/10/14 1430  Aaron BarretteMarcy Pfeiffer, MD 11/11/14 [phone]

## 2014-11-11 ENCOUNTER — Emergency Department (HOSPITAL_COMMUNITY): Payer: Self-pay

## 2014-11-11 DIAGNOSIS — F1410 Cocaine abuse, uncomplicated: Secondary | ICD-10-CM | POA: Diagnosis present

## 2014-11-11 DIAGNOSIS — F1910 Other psychoactive substance abuse, uncomplicated: Secondary | ICD-10-CM | POA: Diagnosis present

## 2014-11-11 DIAGNOSIS — F1510 Other stimulant abuse, uncomplicated: Secondary | ICD-10-CM | POA: Diagnosis present

## 2014-11-11 DIAGNOSIS — F1994 Other psychoactive substance use, unspecified with psychoactive substance-induced mood disorder: Secondary | ICD-10-CM

## 2014-11-11 NOTE — ED Notes (Signed)
Radiology informed that patient is now able to be taken for CT.

## 2014-11-11 NOTE — ED Notes (Signed)
C/o increased anxiety.  Requested and received ativan 1mg .  Will reassess  q 30 minutes.

## 2014-11-11 NOTE — ED Notes (Signed)
Patient tired. Remains anxious. Rates anxiety 7/10. Denies SI, HI, AVH. [facility] Reminded patient about his actions and treatment on 11/10/14.   Encouragement offered. Given Benadryl, Hydrocortisone cream, Ambien. Snack provided.  Q 15 safety checks continue.

## 2014-11-11 NOTE — ED Notes (Signed)
Patient continues to refuse PO medications.

## 2014-11-11 NOTE — ED Notes (Signed)
Patient was sleeping unable to obtain vitals will let day shift know

## 2014-11-11 NOTE — ED Notes (Signed)
Patient continues to struggle and try to get out of the bed. Patient is unstable. Continues to say he wants to kill himself. Thrashing in the bed, attempting self harm. 4 point restraints in place. Press photographerCharge nurse and security at bedside.

## 2014-11-11 NOTE — Consult Note (Signed)
[facility] Consult   Reason for Consult:  Paranoia Referring Physician:  EDP Patient Identification: Aaron Townsend MRN:  [id number] Principal Diagnosis: Substance induced mood disorder Diagnosis:   Patient Active Problem List   Diagnosis Date Noted  . Polysubstance abuse [F19.10] 11/11/2014    Priority: High  . Cocaine abuse [F14.10] 11/11/2014    Priority: High  . Amphetamine abuse [F15.10] 11/11/2014    Priority: High  . Substance induced mood disorder [F19.94] 11/11/2014    Priority: High    Total Time spent with patient: 45 minutes  Subjective:   Aaron Townsend is a 34 y.o. male patient will be re-evaluated in the am.  HPI:  The patient had been using amphetamines and cocaine prior to admission when he started having paranoia that someone was following him and voices.  He was given PRN antipsychotic medications and is not having these symptoms on assessment today.  Aaron Townsend reports feeling tired with a headache.  Denies homicidal/suicidal ideations,hallucinations, and alcohol abuse.  He will be re-evaluated in the am for stability. HPI Elements:   Location:  generalized. Quality:  acute. Severity:  moderate. Timing:  constant. Duration:  day. Context:  drug use.  Past Medical History: History reviewed. No pertinent past medical history.  Past Surgical History  Procedure Laterality Date  . Tonsillectomy     Family History: History reviewed. No pertinent family history. Social History:  History  Alcohol Use  . Yes    Comment: social     History  Drug Use No    History   Social History  . Marital Status: Single    Spouse Name: N/A  . Number of Children: N/A  . Years of Education: N/A   Social History Main Topics  . Smoking status: Never Smoker   . Smokeless tobacco: Not on file  . Alcohol Use: Yes     Comment: social  . Drug Use: No  . Sexual Activity: Not on file   Other Topics Concern  . None   Social History Narrative   Additional Social  History:    Pain Medications: See PTA, denies current abuse reports last use about 4-5 years ago Prescriptions: Denies, See PTA Over the Counter: See PTA History of alcohol / drug use?: Yes Longest period of sobriety (when/how long): 4-5 years for pain medications, months etoh  Negative Consequences of Use:  (none reported) Withdrawal Symptoms:  (none reported, however, very paranoid) Name of Substance 1: methamphetamines 1 - Age of First Use: 24 about a week and a half ago 1 - Amount (size/oz): uncertain 1 - Frequency: one time use reported 1 - Duration: one time use reported 1 - Last Use / Amount: 1.5 weeks ago Name of Substance 2: etoh 2 - Age of First Use: 20 2 - Amount (size/oz): 1-5 drinks, usually one or two 2 - Frequency: about once per month 2 - Duration: years at this level  2 - Last Use / Amount: abotu 1.5 weeks ago a beer Name of Substance 3: THC 3 - Age of First Use: 26 3 - Amount (size/oz): "a little bit" 3 - Frequency: about once a month 3 - Duration: about 7 years 3 - Last Use / Amount: 2.5 to three weeks ago  Name of Substance 4: cocaine 4 - Age of First Use: 28 4 - Amount (size/oz): unable to provide details due to paranoia, distracted and requested officer stand in front of him  4 - Frequency: unable to provide details due to paranoia,  distracted and requested officer stand in front of him  4 - Duration: unable to provide details due to paranoia, distracted and requested officer stand in front of him  4 - Last Use / Amount: a few months Name of Substance 5: pain medications 5 - Age of First Use: 21 5 - Amount (size/oz): unable to provide details due to paranoia, distracted and requested officer stand in front of him  5 - Frequency: unable to provide details due to paranoia, distracted and requested officer stand in front of him  5 - Duration: unable to provide details due to paranoia, distracted and requested officer stand in front of him  5 - Last Use /  Amount: 4-5 years ago           Allergies:  No Known Allergies  Labs:  Results for orders placed or performed during the hospital encounter of 11/10/14 (from the past 48 hour(s))  Drug screen panel, emergency     Status: Abnormal   Collection Time: 11/10/14  8:12 PM  Result Value Ref Range   Opiates NONE DETECTED NONE DETECTED   Cocaine POSITIVE (A) NONE DETECTED   Benzodiazepines NONE DETECTED NONE DETECTED   Amphetamines POSITIVE (A) NONE DETECTED   Tetrahydrocannabinol POSITIVE (A) NONE DETECTED   Barbiturates NONE DETECTED NONE DETECTED    Comment:        DRUG SCREEN FOR MEDICAL PURPOSES ONLY.  IF CONFIRMATION IS NEEDED FOR ANY PURPOSE, NOTIFY LAB WITHIN 5 DAYS.        LOWEST DETECTABLE LIMITS FOR URINE DRUG SCREEN Drug Class       Cutoff (ng/mL) Amphetamine      1000 Barbiturate      200 Benzodiazepine   009 Tricyclics       233 Opiates          300 Cocaine          300 THC              50   CBC WITH DIFFERENTIAL     Status: None   Collection Time: 11/10/14  8:52 PM  Result Value Ref Range   WBC 7.3 4.0 - 10.5 K/uL   RBC 4.82 4.22 - 5.81 MIL/uL   Hemoglobin 15.8 13.0 - 17.0 g/dL   HCT 46.9 39.0 - 52.0 %   MCV 97.3 78.0 - 100.0 fL   MCH 32.8 26.0 - 34.0 pg   MCHC 33.7 30.0 - 36.0 g/dL   RDW 12.3 11.5 - 15.5 %   Platelets 273 150 - 400 K/uL   Neutrophils Relative % 68 43 - 77 %   Neutro Abs 5.0 1.7 - 7.7 K/uL   Lymphocytes Relative 22 12 - 46 %   Lymphs Abs 1.6 0.7 - 4.0 K/uL   Monocytes Relative 7 3 - 12 %   Monocytes Absolute 0.5 0.1 - 1.0 K/uL   Eosinophils Relative 2 0 - 5 %   Eosinophils Absolute 0.2 0.0 - 0.7 K/uL   Basophils Relative 1 0 - 1 %   Basophils Absolute 0.0 0.0 - 0.1 K/uL  Comprehensive metabolic panel     Status: Abnormal   Collection Time: 11/10/14  8:52 PM  Result Value Ref Range   Sodium 137 135 - 145 mmol/L   Potassium 3.5 3.5 - 5.1 mmol/L   Chloride 101 101 - 111 mmol/L   CO2 25 22 - 32 mmol/L   Glucose, Bld 113 (H) 70 - 99  mg/dL   BUN 18 6 -  20 mg/dL   Creatinine, Ser 1.14 0.61 - 1.24 mg/dL   Calcium 9.9 8.9 - 10.3 mg/dL   Total Protein 8.8 (H) 6.5 - 8.1 g/dL   Albumin 5.1 (H) 3.5 - 5.0 g/dL   AST 37 15 - 41 U/L   ALT 28 17 - 63 U/L   Alkaline Phosphatase 50 38 - 126 U/L   Total Bilirubin 0.9 0.3 - 1.2 mg/dL   GFR calc non Af Amer >60 >60 mL/min   GFR calc Af Amer >60 >60 mL/min    Comment: (NOTE) The eGFR has been calculated using the CKD EPI equation. This calculation has not been validated in all clinical situations. eGFR's persistently <60 mL/min signify possible Chronic Kidney Disease.    Anion gap 11 5 - 15  Ethanol     Status: None   Collection Time: 11/10/14  8:52 PM  Result Value Ref Range   Alcohol, Ethyl (B) <5 <5 mg/dL    Comment:        LOWEST DETECTABLE LIMIT FOR SERUM ALCOHOL IS 11 mg/dL FOR MEDICAL PURPOSES ONLY   Acetaminophen level     Status: Abnormal   Collection Time: 11/10/14  8:52 PM  Result Value Ref Range   Acetaminophen (Tylenol), Serum <10 (L) 10 - 30 ug/mL    Comment:        THERAPEUTIC CONCENTRATIONS VARY SIGNIFICANTLY. A RANGE OF 10-30 ug/mL MAY BE AN EFFECTIVE CONCENTRATION FOR MANY PATIENTS. HOWEVER, SOME ARE BEST TREATED AT CONCENTRATIONS OUTSIDE THIS RANGE. ACETAMINOPHEN CONCENTRATIONS >150 ug/mL AT 4 HOURS AFTER INGESTION AND >50 ug/mL AT 12 HOURS AFTER INGESTION ARE OFTEN ASSOCIATED WITH TOXIC REACTIONS.   Salicylate level     Status: None   Collection Time: 11/10/14  8:52 PM  Result Value Ref Range   Salicylate Lvl <3.8 2.8 - 30.0 mg/dL    Vitals: Blood pressure 113/58, pulse 73, temperature 97.7 F (36.5 C), temperature source Oral, resp. rate 20, SpO2 100 %.  Risk to Self: Suicidal Ideation: No Suicidal Intent: No Is patient at risk for suicide?: No Suicidal Plan?: No Access to Means: No What has been your use of drugs/alcohol within the last 12 months?: Pt reports first use of methamphetamines about a week ago, one time use. He reports  infrequent use of cocaine, details unknow, and about monthly use of etoh and THC How many times?: 0 Other Self Harm Risks: none Triggers for Past Attempts: None known Intentional Self Injurious Behavior: None Risk to Others: Homicidal Ideation: No Thoughts of Harm to Others: No Current Homicidal Intent: No Current Homicidal Plan: No Access to Homicidal Means: No Identified Victim: none History of harm to others?: No Assessment of Violence: None Noted Violent Behavior Description: none Does patient have access to weapons?: No Criminal Charges Pending?: No Does patient have a court date: No Prior Inpatient Therapy: Prior Inpatient Therapy: No Prior Therapy Dates: NA Prior Therapy Facilty/Provider(s): NA Reason for Treatment: NA Prior Outpatient Therapy: Prior Outpatient Therapy: No Prior Therapy Dates: NA Prior Therapy Facilty/Provider(s): NA Reason for Treatment: NA Does patient have an ACCT team?: No Does patient have Intensive In-House Services?  : No Does patient have [facility] services? : No Does patient have [facility] services?: No  Current Facility-Administered Medications  Medication Dose Route Frequency Provider Last Rate Last Dose  . acetaminophen (TYLENOL) tablet 650 mg  650 mg Oral Q4H PRN Mercedes Camprubi-Soms, PA-C      . alum & mag hydroxide-simeth (MAALOX/MYLANTA) 200-200-20 MG/5ML suspension 30 mL  30 mL  Oral PRN Mercedes Camprubi-Soms, PA-C      . diphenhydrAMINE (BENADRYL) capsule 25 mg  25 mg Oral Q6H Mercedes Camprubi-Soms, PA-C   25 mg at 11/10/14 2047  . hydrocortisone cream 1 %   Topical BID Mercedes Camprubi-Soms, PA-C      . ibuprofen (ADVIL,MOTRIN) tablet 600 mg  600 mg Oral Q8H PRN Mercedes Camprubi-Soms, PA-C      . LORazepam (ATIVAN) tablet 1 mg  1 mg Oral Q8H PRN Mercedes Camprubi-Soms, PA-C      . ondansetron (ZOFRAN) tablet 4 mg  4 mg Oral Q8H PRN Mercedes Camprubi-Soms, PA-C      . zolpidem (AMBIEN) tablet 5 mg  5 mg Oral QHS PRN Mercedes  Camprubi-Soms, PA-C       Current Outpatient Prescriptions  Medication Sig Dispense Refill  . diphenhydrAMINE (BENADRYL) 25 MG tablet Take 1 tablet (25 mg total) by mouth every 6 (six) hours. (Patient not taking: Reported on 11/10/2014) 20 tablet 0  . hydrocortisone cream 1 % Apply to affected area 2 times daily (Patient not taking: Reported on 11/10/2014) 15 g 0    Musculoskeletal: Strength & Muscle Tone: within normal limits Gait & Station: normal Patient leans: N/A  Psychiatric Specialty Exam:     Blood pressure 113/58, pulse 73, temperature 97.7 F (36.5 C), temperature source Oral, resp. rate 20, SpO2 100 %.There is no height or weight on file to calculate BMI.  General Appearance: Disheveled  Eye Sport and exercise psychologist::  Fair  Speech:  Normal Rate  Volume:  Normal  Mood:  calm, cooperative, sleepy  Affect:  Congruent  Thought Process:  Coherent  Orientation:  Full (Time, Place, and Person)  Thought Content:  WDL  Suicidal Thoughts:  No  Homicidal Thoughts:  No  Memory:  Immediate;   Fair Recent;   Fair Remote;   Fair  Judgement:  Impaired  Insight:  Fair  Psychomotor Activity:  Decreased  Concentration:  Fair  Recall:  [employer] of Knowledge:Fair  Language: Good  Akathisia:  No  Handed:  Right  AIMS (if indicated):     Assets:  Leisure Time Physical Health Resilience Social Support  ADL's:  Intact  Cognition: WNL  Sleep:      Medical Decision Making: Review of Psycho-Social Stressors (1), Review or order clinical lab tests (1) and Review of Medication Regimen & Side Effects (2)  Treatment Plan Summary: Daily contact with patient to assess and evaluate symptoms and progress in treatment, Medication management and Plan re-evaluate in the am  Plan:  Supportive therapy provided about ongoing stressors. Disposition: Re-evaluate in the am for stability  Waylan Boga, PMH-NP 11/11/2014 2:36 PM Patient seen face-to-face for psychiatric evaluation, chart reviewed and case  discussed with the physician extender and developed treatment plan. Reviewed the information documented and agree with the treatment plan. Corena Pilgrim, MD

## 2014-11-11 NOTE — ED Notes (Signed)
Patient continues to try and harm himself by attempting to hit his head. Patient encouraged to stay in bed. Bed moved from wall. [employer] and writer remain in the room with patient.

## 2014-11-11 NOTE — ED Notes (Signed)
Charge nurse went with patient for CT.

## 2014-11-11 NOTE — ED Notes (Signed)
Unable to complete nursing and physical assessment.  Patient is resting quietly with eyes closed. Respirations are even and unlabored.

## 2014-11-12 DIAGNOSIS — F419 Anxiety disorder, unspecified: Secondary | ICD-10-CM | POA: Insufficient documentation

## 2014-11-12 NOTE — [facility] Suicide Risk Assessment (Cosign Needed)
Suicide Risk Assessment  Discharge Assessment   [facility]   Demographic Factors:  Male, Adolescent or young adult, Caucasian and Low socioeconomic status  Total Time spent with patient: 20 minutes  Musculoskeletal: Strength & Muscle Tone: within normal limits Gait & Station: normal Patient leans: N/A  Psychiatric Specialty Exam:     Blood pressure 99/54, pulse 83, temperature 99.4 F (37.4 Townsend), temperature source Oral, resp. rate 18, SpO2 100 %.There is no height or weight on file to calculate BMI.  General Appearance: Casual and Disheveled  Eye Contact::  Good  Speech:  Clear and Coherent and Normal Rate409  Volume:  Normal  Mood:  Euthymic  Affect:  Congruent  Thought Process:  Coherent, Goal Directed and Intact  Orientation:  Full (Time, Place, and Person)  Thought Content:  WDL  Suicidal Thoughts:  No  Homicidal Thoughts:  No  Memory:  Immediate;   Good Recent;   Good Remote;   Good  Judgement:  Good  Insight:  Good  Psychomotor Activity:  Normal  Concentration:  Good  Recall:  NA  Fund of Knowledge:Good  Language: Good  Akathisia:  NA  Handed:  Right  AIMS (if indicated):     Assets:  Desire for Improvement  Sleep:     Cognition: WNL  ADL's:  Impaired      Has this patient used any form of tobacco in the last 30 days? (Cigarettes, Smokeless Tobacco, Cigars, and/or Pipes) Yes, A prescription for an FDA-approved tobacco cessation medication was offered at discharge and the patient refused  Mental Status Per Nursing Assessment::   On Admission:     Current Mental Status by Physician: NA  Loss Factors: NA  Historical Factors: NA  Risk Reduction Factors:   Sense of responsibility to family, Religious beliefs about death, Employed and Living with another person, especially a relative  Continued Clinical Symptoms:  Alcohol/Substance Abuse/Dependencies  Cognitive Features That Contribute To Risk:  Polarized thinking     Suicide Risk:  Minimal: No identifiable suicidal ideation.  Patients presenting with no risk factors but with morbid ruminations; may be classified as minimal risk based on the severity of the depressive symptoms  Principal Problem: Substance induced mood disorder Discharge Diagnoses:  Patient Active Problem List   Diagnosis Date Noted  . Anxiety [F41.9]   . Polysubstance abuse [F19.10] 11/11/2014  . Cocaine abuse [F14.10] 11/11/2014  . 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 Substance induced mood disorder [F19.94] 11/11/2014      Plan Of Care/Follow-up recommendations:  Activity:  as tolerated Diet:  regular  Is patient on multiple antipsychotic therapies at discharge:  No   Has Patient had three or more failed trials of antipsychotic monotherapy by history:  No  Recommended Plan for Multiple Antipsychotic Therapies: NA    Aaron Townsend   PMHNP-BC 11/12/2014, 2:10 PM

## 2014-11-12 NOTE — ED Notes (Signed)
Acuity low.  Patient sleeping.

## 2014-11-12 NOTE — ED Notes (Signed)
Patient discharged home per MD order.  He denies SI/HI/AVH.  Patient will take the bus for transportation home.  He plans to return to work this afternoon.  Patient's valuables were returned and patient signed for same.

## 2014-11-12 NOTE — Discharge Instructions (Addendum)
For your ongoing mental health needs, you are advised to follow up with [facility].  New and returning patients are seen at their walk-in clinic.  Walk-in hours are Monday - Friday from 8:00 am - 3:00 pm.  Walk-in patients are seen on a first come, first served basis.  Try to arrive as early as possible for he best chance of being seen the same day:       [facility]      [address]      [place], [place] [address]      [phone]  To help you maintain a sober lifestyle, a substance abuse treatment program may be helpful to you.  Contact [facility] at your earliest opportunity to see about enrolling in their program:       [facility]      [address]      [place], [place] [address]      [phone]      New patients are seen at the walk-in clinic every Tuesday from 9:00 am - 12:00 pm.

## 2014-11-12 NOTE — Consult Note (Signed)
[facility] Consult   Reason for Consult:  Paranoia Referring Physician:  EDP Patient Identification: Aaron Townsend MRN:  [id number] Principal Diagnosis: Substance induced mood disorder Diagnosis:   Patient Active Problem List   Diagnosis Date Noted  . Polysubstance abuse [F19.10] 11/11/2014  . Cocaine abuse [F14.10] 11/11/2014  . 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 Substance induced mood disorder [F19.94] 11/11/2014    Total Time spent with patient: 45 minutes  Subjective:   Aaron Townsend is a 34 y.o. male patient will be re-evaluated in the am.  HPI:  The patient had been using amphetamines and cocaine prior to admission when he started having paranoia that someone was following him and voices.  He was given PRN antipsychotic medications and is not having these symptoms on assessment today.  Aaron Townsend reports feeling tired with a headache.  Denies homicidal/suicidal ideations,hallucinations, and alcohol abuse.  He will be re-evaluated in the am for stability.  Reviewed above note with updates.  Patient is calm, awake and alert today.  He stated that he believes using Methamphetamine caused his Psychotic symptoms.  He adamantly denied any hx of Mental illness.  He admitted that he has been using Cocaine and that he tried Methamphetamine and started feeling" crazy"  He stated that he will not touch any substances anymore.  He denies SI/HI/AVH and will be discharged.  HPI Elements:   Location:  generalized. Quality:  acute. Severity:  moderate. Timing:  constant. Duration:  day. Context:  drug use.  Past Medical History: History reviewed. No pertinent past medical history.  Past Surgical History  Procedure Laterality Date  . Tonsillectomy     Family History: History reviewed. No pertinent family history. Social History:  History  Alcohol Use  . Yes    Comment: social     History  Drug Use No    History   Social History  . Marital Status: Single    Spouse Name:  N/A  . Number of Children: N/A  . Years of Education: N/A   Social History Main Topics  . Smoking status: Never Smoker   . Smokeless tobacco: Not on file  . Alcohol Use: Yes     Comment: social  . Drug Use: No  . Sexual Activity: Not on file   Other Topics Concern  . None   Social History Narrative   Additional Social History:    Pain Medications: See PTA, denies current abuse reports last use about 4-5 years ago Prescriptions: Denies, See PTA Over the Counter: See PTA History of alcohol / drug use?: Yes Longest period of sobriety (when/how long): 4-5 years for pain medications, months etoh  Negative Consequences of Use:  (none reported) Withdrawal Symptoms:  (none reported, however, very paranoid) Name of Substance 1: methamphetamines 1 - Age of First Use: 20 about a week and a half ago 1 - Amount (size/oz): uncertain 1 - Frequency: one time use reported 1 - Duration: one time use reported 1 - Last Use / Amount: 1.5 weeks ago Name of Substance 2: etoh 2 - Age of First Use: 20 2 - Amount (size/oz): 1-5 drinks, usually one or two 2 - Frequency: about once per month 2 - Duration: years at this level  2 - Last Use / Amount: abotu 1.5 weeks ago a beer Name of Substance 3: THC 3 - Age of First Use: 26 3 - Amount (size/oz): "a little bit" 3 - Frequency: about once a month 3 - Duration: about 7 years 3 -  Last Use / Amount: 2.5 to three weeks ago  Name of Substance 4: cocaine 4 - Age of First Use: 32 4 - Amount (size/oz): unable to provide details due to paranoia, distracted and requested officer stand in front of him  4 - Frequency: unable to provide details due to paranoia, distracted and requested officer stand in front of him  4 - Duration: unable to provide details due to paranoia, distracted and requested officer stand in front of him  4 - Last Use / Amount: a few months Name of Substance 5: pain medications 5 - Age of First Use: 21 5 - Amount (size/oz): unable to  provide details due to paranoia, distracted and requested officer stand in front of him  5 - Frequency: unable to provide details due to paranoia, distracted and requested officer stand in front of him  5 - Duration: unable to provide details due to paranoia, distracted and requested officer stand in front of him  5 - Last Use / Amount: 4-5 years ago           Allergies:  No Known Allergies  Labs:  Results for orders placed or performed during the hospital encounter of 11/10/14 (from the past 48 hour(s))  Drug screen panel, emergency     Status: Abnormal   Collection Time: 11/10/14  8:12 PM  Result Value Ref Range   Opiates NONE DETECTED NONE DETECTED   Cocaine POSITIVE (A) NONE DETECTED   Benzodiazepines NONE DETECTED NONE DETECTED   Amphetamines POSITIVE (A) NONE DETECTED   Tetrahydrocannabinol POSITIVE (A) NONE DETECTED   Barbiturates NONE DETECTED NONE DETECTED    Comment:        DRUG SCREEN FOR MEDICAL PURPOSES ONLY.  IF CONFIRMATION IS NEEDED FOR ANY PURPOSE, NOTIFY LAB WITHIN 5 DAYS.        LOWEST DETECTABLE LIMITS FOR URINE DRUG SCREEN Drug Class       Cutoff (ng/mL) Amphetamine      1000 Barbiturate      200 Benzodiazepine   387 Tricyclics       564 Opiates          300 Cocaine          300 THC              50   CBC WITH DIFFERENTIAL     Status: None   Collection Time: 11/10/14  8:52 PM  Result Value Ref Range   WBC 7.3 4.0 - 10.5 K/uL   RBC 4.82 4.22 - 5.81 MIL/uL   Hemoglobin 15.8 13.0 - 17.0 g/dL   HCT 46.9 39.0 - 52.0 %   MCV 97.3 78.0 - 100.0 fL   MCH 32.8 26.0 - 34.0 pg   MCHC 33.7 30.0 - 36.0 g/dL   RDW 12.3 11.5 - 15.5 %   Platelets 273 150 - 400 K/uL   Neutrophils Relative % 68 43 - 77 %   Neutro Abs 5.0 1.7 - 7.7 K/uL   Lymphocytes Relative 22 12 - 46 %   Lymphs Abs 1.6 0.7 - 4.0 K/uL   Monocytes Relative 7 3 - 12 %   Monocytes Absolute 0.5 0.1 - 1.0 K/uL   Eosinophils Relative 2 0 - 5 %   Eosinophils Absolute 0.2 0.0 - 0.7 K/uL    Basophils Relative 1 0 - 1 %   Basophils Absolute 0.0 0.0 - 0.1 K/uL  Comprehensive metabolic panel     Status: Abnormal   Collection Time: 11/10/14  8:52 PM  Result Value Ref Range   Sodium 137 135 - 145 mmol/L   Potassium 3.5 3.5 - 5.1 mmol/L   Chloride 101 101 - 111 mmol/L   CO2 25 22 - 32 mmol/L   Glucose, Bld 113 (H) 70 - 99 mg/dL   BUN 18 6 - 20 mg/dL   Creatinine, Ser 1.14 0.61 - 1.24 mg/dL   Calcium 9.9 8.9 - 10.3 mg/dL   Total Protein 8.8 (H) 6.5 - 8.1 g/dL   Albumin 5.1 (H) 3.5 - 5.0 g/dL   AST 37 15 - 41 U/L   ALT 28 17 - 63 U/L   Alkaline Phosphatase 50 38 - 126 U/L   Total Bilirubin 0.9 0.3 - 1.2 mg/dL   GFR calc non Af Amer >60 >60 mL/min   GFR calc Af Amer >60 >60 mL/min    Comment: (NOTE) The eGFR has been calculated using the CKD EPI equation. This calculation has not been validated in all clinical situations. eGFR's persistently <60 mL/min signify possible Chronic Kidney Disease.    Anion gap 11 5 - 15  Ethanol     Status: None   Collection Time: 11/10/14  8:52 PM  Result Value Ref Range   Alcohol, Ethyl (B) <5 <5 mg/dL    Comment:        LOWEST DETECTABLE LIMIT FOR SERUM ALCOHOL IS 11 mg/dL FOR MEDICAL PURPOSES ONLY   Acetaminophen level     Status: Abnormal   Collection Time: 11/10/14  8:52 PM  Result Value Ref Range   Acetaminophen (Tylenol), Serum <10 (L) 10 - 30 ug/mL    Comment:        THERAPEUTIC CONCENTRATIONS VARY SIGNIFICANTLY. A RANGE OF 10-30 ug/mL MAY BE AN EFFECTIVE CONCENTRATION FOR MANY PATIENTS. HOWEVER, SOME ARE BEST TREATED AT CONCENTRATIONS OUTSIDE THIS RANGE. ACETAMINOPHEN CONCENTRATIONS >150 ug/mL AT 4 HOURS AFTER INGESTION AND >50 ug/mL AT 12 HOURS AFTER INGESTION ARE OFTEN ASSOCIATED WITH TOXIC REACTIONS.   Salicylate level     Status: None   Collection Time: 11/10/14  8:52 PM  Result Value Ref Range   Salicylate Lvl <7.8 2.8 - 30.0 mg/dL    Vitals: Blood pressure 99/54, pulse 83, temperature 99.4 F (37.4 C),  temperature source Oral, resp. rate 18, SpO2 100 %.  Risk to Self: Suicidal Ideation: No Suicidal Intent: No Is patient at risk for suicide?: No Suicidal Plan?: No Access to Means: No What has been your use of drugs/alcohol within the last 12 months?: Pt reports first use of methamphetamines about a week ago, one time use. He reports infrequent use of cocaine, details unknow, and about monthly use of etoh and THC How many times?: 0 Other Self Harm Risks: none Triggers for Past Attempts: None known Intentional Self Injurious Behavior: None Risk to Others: Homicidal Ideation: No Thoughts of Harm to Others: No Current Homicidal Intent: No Current Homicidal Plan: No Access to Homicidal Means: No Identified Victim: none History of harm to others?: No Assessment of Violence: None Noted Violent Behavior Description: none Does patient have access to weapons?: No Criminal Charges Pending?: No Does patient have a court date: No Prior Inpatient Therapy: Prior Inpatient Therapy: No Prior Therapy Dates: NA Prior Therapy Facilty/Provider(s): NA Reason for Treatment: NA Prior Outpatient Therapy: Prior Outpatient Therapy: No Prior Therapy Dates: NA Prior Therapy Facilty/Provider(s): NA Reason for Treatment: NA Does patient have an ACCT team?: No Does patient have Intensive In-House Services?  : No Does patient have [facility] services? : No Does patient have  [facility] services?: No  Current Facility-Administered Medications  Medication Dose Route Frequency Provider Last Rate Last Dose  . acetaminophen (TYLENOL) tablet 650 mg  650 mg Oral Q4H PRN Mercedes Camprubi-Soms, PA-C      . alum & mag hydroxide-simeth (MAALOX/MYLANTA) 200-200-20 MG/5ML suspension 30 mL  30 mL Oral PRN Mercedes Camprubi-Soms, PA-C      . diphenhydrAMINE (BENADRYL) capsule 25 mg  25 mg Oral Q6H Mercedes Camprubi-Soms, PA-C   25 mg at 11/12/14 0109  . hydrocortisone cream 1 %   Topical BID Mercedes Camprubi-Soms, PA-C      .  ibuprofen (ADVIL,MOTRIN) tablet 600 mg  600 mg Oral Q8H PRN Mercedes Camprubi-Soms, PA-C      . LORazepam (ATIVAN) tablet 1 mg  1 mg Oral Q8H PRN Mercedes Camprubi-Soms, PA-C   1 mg at 11/11/14 1602  . ondansetron (ZOFRAN) tablet 4 mg  4 mg Oral Q8H PRN Mercedes Camprubi-Soms, PA-C      . zolpidem (AMBIEN) tablet 5 mg  5 mg Oral QHS PRN Mercedes Camprubi-Soms, PA-C   5 mg at 11/11/14 2050   Current Outpatient Prescriptions  Medication Sig Dispense Refill  . diphenhydrAMINE (BENADRYL) 25 MG tablet Take 1 tablet (25 mg total) by mouth every 6 (six) hours. (Patient not taking: Reported on 11/10/2014) 20 tablet 0  . hydrocortisone cream 1 % Apply to affected area 2 times daily (Patient not taking: Reported on 11/10/2014) 15 g 0   ROS is negative for 10 systems reviewed.  Musculoskeletal: Strength & Muscle Tone: within normal limits Gait & Station: normal Patient leans: N/A  Psychiatric Specialty Exam:     Blood pressure 99/54, pulse 83, temperature 99.4 F (37.4 C), temperature source Oral, resp. rate 18, SpO2 100 %.There is no height or weight on file to calculate BMI.  General Appearance: Disheveled  Eye Sport and exercise psychologist::  Fair  Speech:  Normal Rate  Volume:  Normal  Mood:  calm, cooperative,   Affect:  Congruent  Thought Process:  Coherent  Orientation:  Full (Time, Place, and Person)  Thought Content:  WDL  Suicidal Thoughts:  No  Homicidal Thoughts:  No  Memory:  Immediate;   Fair Recent;   Fair Remote;   Fair  Judgement:  Impaired  Insight:  Fair  Psychomotor Activity:  Decreased  Concentration:  Fair  Recall:  [employer] of Knowledge:Fair  Language: Good  Akathisia:  No  Handed:  Right  AIMS (if indicated):     Assets:  Leisure Time Physical Health Resilience Social Support  ADL's:  Intact  Cognition: WNL  Sleep:      Medical Decision Making: Review of Psycho-Social Stressors (1), Review or order clinical lab tests (1) and Review of Medication Regimen & Side Effects  (2)  Treatment Plan Summary: Plan discharge home  Plan:  Follow up with [facility] for substance abuse treatment and Mental health. Disposition: Discharge home  Aaron Townsend, PMHNP-BC 11/12/2014 1:56 PM Patient seen face-to-face for psychiatric evaluation, chart reviewed and case discussed with the physician extender and developed treatment plan. Reviewed the information documented and agree with the treatment plan. Corena Pilgrim, MD

## 2014-11-12 NOTE — ED Notes (Signed)
Acuity low.  Patient to be discharged today.

## 2014-11-12 NOTE — BH Assessment (Addendum)
[facility] Assessment Progress Note  Per Thedore MinsMojeed Akintayo, MD this pt does not require psychiatric hospitalization.  [facility].  IVC has been rescinded.  [facility] information has been included in his discharge instructions.  Pt's nurse, Rayfield CitizenCaroline, has been notified.  Doylene Canninghomas Jordyne Poehlman, MA Triage Specialist [phone]  Addendum:  Dahlia ByesJosephine Onuoha, NP asked this writer to offer pt substance abuse treatment information.  [facility] has been added to his discharge instructions.  Pt's nurse, Rayfield CitizenCaroline, has been notified.  Doylene Canninghomas Dennise Bamber, MA Triage Specialist [phone]

## 2014-11-21 ENCOUNTER — Emergency Department (HOSPITAL_COMMUNITY)
Admission: EM | Admit: 2014-11-21 | Discharge: 2014-11-23 | Disposition: A | Discharge status: Other Acute Inpatient Hospital | Payer: Self-pay | Attending: Emergency Medicine | Admitting: Emergency Medicine

## 2014-11-21 ENCOUNTER — Emergency Department (HOSPITAL_COMMUNITY): Payer: Self-pay

## 2014-11-21 ENCOUNTER — Encounter (HOSPITAL_COMMUNITY): Payer: Self-pay

## 2014-11-21 DIAGNOSIS — F209 Schizophrenia, unspecified: Secondary | ICD-10-CM | POA: Insufficient documentation

## 2014-11-21 DIAGNOSIS — R079 Chest pain, unspecified: Secondary | ICD-10-CM | POA: Insufficient documentation

## 2014-11-21 DIAGNOSIS — F1910 Other psychoactive substance abuse, uncomplicated: Secondary | ICD-10-CM | POA: Diagnosis present

## 2014-11-21 DIAGNOSIS — Z79899 Other long term (current) drug therapy: Secondary | ICD-10-CM | POA: Insufficient documentation

## 2014-11-21 DIAGNOSIS — Z8659 Personal history of other mental and behavioral disorders: Secondary | ICD-10-CM

## 2014-11-21 DIAGNOSIS — R0602 Shortness of breath: Secondary | ICD-10-CM | POA: Insufficient documentation

## 2014-11-21 HISTORY — DX: Panic disorder (episodic paroxysmal anxiety): F41.0

## 2014-11-21 HISTORY — DX: Other psychoactive substance use, unspecified with psychoactive substance-induced mood disorder: F19.94

## 2014-11-21 HISTORY — DX: Anxiety disorder, unspecified: F41.9

## 2014-11-21 HISTORY — DX: Schizophrenia, unspecified: F20.9

## 2014-11-21 HISTORY — DX: Other psychoactive substance abuse, uncomplicated: F19.10

## 2014-11-21 LAB — CBC
HEMATOCRIT: 44.5 % (ref 39.0–52.0)
HEMOGLOBIN: 15 g/dL (ref 13.0–17.0)
MCH: 32.8 pg (ref 26.0–34.0)
MCHC: 33.7 g/dL (ref 30.0–36.0)
MCV: 97.2 fL (ref 78.0–100.0)
Platelets: 261 10*3/uL (ref 150–400)
RBC: 4.58 MIL/uL (ref 4.22–5.81)
RDW: 12.6 % (ref 11.5–15.5)
WBC: 7.2 10*3/uL (ref 4.0–10.5)

## 2014-11-21 LAB — I-STAT TROPONIN, ED: TROPONIN I, POC: 0 ng/mL (ref 0.00–0.08)

## 2014-11-21 MED ORDER — HALOPERIDOL 5 MG PO TABS: 5.0000 mg | ORAL_TABLET | Freq: Two times a day (BID) | ORAL | Status: DC

## 2014-11-21 MED ORDER — DIPHENHYDRAMINE HCL 50 MG/ML IJ SOLN: 25.0000 mg | Freq: Once | INTRAMUSCULAR | Status: DC

## 2014-11-21 MED ORDER — LORAZEPAM 1 MG PO TABS: 1.0000 mg | ORAL_TABLET | Freq: Once | ORAL | Status: DC

## 2014-11-21 MED ORDER — ZIPRASIDONE MESYLATE 20 MG IM SOLR: 10.0000 mg | Freq: Once | INTRAMUSCULAR | Status: DC

## 2014-11-21 MED ORDER — RISPERIDONE 0.5 MG PO TABS
0.5000 mg | ORAL_TABLET | Freq: Once | ORAL | Status: AC
  Administered 2014-11-21: 0.5 mg via ORAL
  Filled 2014-11-21: qty 1

## 2014-11-21 MED ORDER — LORAZEPAM 1 MG PO TABS
1.0000 mg | ORAL_TABLET | Freq: Once | ORAL | Status: AC
  Administered 2014-11-21: 1 mg via ORAL
  Filled 2014-11-21: qty 1

## 2014-11-21 NOTE — ED Notes (Signed)
Pt states that he wants a psych eval, pt denies si/hi, but states that he is hearing voices but cannot understand what the voices are saying. Pt denies visual hallucinations. Pt states that he is having racing thoughts.

## 2014-11-21 NOTE — ED Notes (Signed)
Per EMS, pt had anxiety attack that caused him to have chest pain 30 mins prior to arrival. Pt was on clonipin to help with anxiety but has been off of it for about a year due to lack of insurance. Pt ambulatory in the door with ems. Pt not having any active chest pain at this time. EMS VS 144/76, 108HR, RR 26, CBG 203(not diabetic hx), lung sounds clear. Pt states that sob and chest pain comes with anxiety and goes away when anxiety is gone.

## 2014-11-21 NOTE — ED Notes (Signed)
NT, security with patient in room.

## 2014-11-21 NOTE — ED Notes (Signed)
NT and security outside pt's door.

## 2014-11-21 NOTE — BH Assessment (Signed)
Received notification of TTS consult request. Spoke to a Arthor CaptainAbigail Harris, PA-C who said Pt is not ready for TTS consult. TTS consult order will be resubmitted when Pt is ready.  Harlin RainFord Ellis Patsy BaltimoreWarrick Jr, LPC, [facility], [facility] Triage Specialist [phone]

## 2014-11-21 NOTE — ED Notes (Signed)
Pt declines to be hooked up to any monitor.

## 2014-11-21 NOTE — ED Notes (Addendum)
Pt now states that he is suicidal, denies HI. Security and our tech as a Comptrollersitter in room with patient and have been in the room with the pt since he got here. Press photographerCharge nurse, staffing, security, and [employer] notified.

## 2014-11-21 NOTE — ED Notes (Addendum)
Pt constantly outside of room and states that he doesn't feel safe. Security notified. Pt escorted back to room by this RN, Arthor CaptainAbigail Harris, and security. Pt back in room now and won't sit still, stating "I don't feel safe" Arthor CaptainAbigail Harris ordered 1mg  ativan verbally.

## 2014-11-21 NOTE — ED Notes (Signed)
Pt refused to be connected to the monitor.

## 2014-11-22 ENCOUNTER — Encounter (HOSPITAL_COMMUNITY): Payer: Self-pay | Admitting: *Deleted

## 2014-11-22 DIAGNOSIS — F22 Delusional disorders: Secondary | ICD-10-CM

## 2014-11-22 DIAGNOSIS — R45851 Suicidal ideations: Secondary | ICD-10-CM

## 2014-11-22 DIAGNOSIS — F1910 Other psychoactive substance abuse, uncomplicated: Secondary | ICD-10-CM

## 2014-11-22 DIAGNOSIS — Z8659 Personal history of other mental and behavioral disorders: Secondary | ICD-10-CM

## 2014-11-22 DIAGNOSIS — R45850 Homicidal ideations: Secondary | ICD-10-CM

## 2014-11-22 LAB — COMPREHENSIVE METABOLIC PANEL
ALK PHOS: 40 U/L (ref 38–126)
ALT: 20 U/L (ref 17–63)
AST: 23 U/L (ref 15–41)
Albumin: 4.6 g/dL (ref 3.5–5.0)
Anion gap: 14 (ref 5–15)
BUN: 6 mg/dL (ref 6–20)
CALCIUM: 9.5 mg/dL (ref 8.9–10.3)
CO2: 22 mmol/L (ref 22–32)
Chloride: 97 mmol/L — ABNORMAL LOW (ref 101–111)
Creatinine, Ser: 1.13 mg/dL (ref 0.61–1.24)
Glucose, Bld: 96 mg/dL (ref 65–99)
Potassium: 3.1 mmol/L — ABNORMAL LOW (ref 3.5–5.1)
SODIUM: 133 mmol/L — AB (ref 135–145)
Total Bilirubin: 0.8 mg/dL (ref 0.3–1.2)
Total Protein: 8.1 g/dL (ref 6.5–8.1)

## 2014-11-22 LAB — ACETAMINOPHEN LEVEL: Acetaminophen (Tylenol), Serum: <10 ug/mL — ABNORMAL LOW (ref 10–30)

## 2014-11-22 LAB — I-STAT TROPONIN, ED: Troponin i, poc: 0.01 ng/mL (ref 0.00–0.08)

## 2014-11-22 LAB — SALICYLATE LEVEL

## 2014-11-22 LAB — RAPID URINE DRUG SCREEN, HOSP PERFORMED
Amphetamines: POSITIVE — AB
Barbiturates: NOT DETECTED
Benzodiazepines: NOT DETECTED
Cocaine: POSITIVE — AB
Opiates: NOT DETECTED
Tetrahydrocannabinol: POSITIVE — AB

## 2014-11-22 MED ORDER — LORAZEPAM 1 MG PO TABS
1.0000 mg | ORAL_TABLET | Freq: Once | ORAL | Status: AC
  Administered 2014-11-22: 1 mg via ORAL

## 2014-11-22 MED ORDER — ONDANSETRON HCL 4 MG PO TABS: 4.0000 mg | ORAL_TABLET | Freq: Three times a day (TID) | ORAL | Status: DC | PRN

## 2014-11-22 MED ORDER — ZIPRASIDONE MESYLATE 20 MG IM SOLR
10.0000 mg | Freq: Once | INTRAMUSCULAR | Status: AC
  Administered 2014-11-22: 10 mg via INTRAMUSCULAR
  Filled 2014-11-22: qty 20

## 2014-11-22 MED ORDER — ALUM & MAG HYDROXIDE-SIMETH 200-200-20 MG/5ML PO SUSP
30.0000 mL | ORAL | Status: DC | PRN
  Administered 2014-11-22: 30 mL via ORAL
  Filled 2014-11-22: qty 30

## 2014-11-22 MED ORDER — OLANZAPINE 10 MG PO TBDP
10.0000 mg | ORAL_TABLET | Freq: Once | ORAL | Status: AC
  Administered 2014-11-22: 10 mg via ORAL
  Filled 2014-11-22: qty 1

## 2014-11-22 MED ORDER — OLANZAPINE 5 MG PO TBDP
5.0000 mg | ORAL_TABLET | Freq: Three times a day (TID) | ORAL | Status: DC | PRN
  Filled 2014-11-22: qty 1

## 2014-11-22 MED ORDER — NICOTINE 21 MG/24HR TD PT24: 21.0000 mg | MEDICATED_PATCH | Freq: Every day | TRANSDERMAL | Status: DC

## 2014-11-22 MED ORDER — POTASSIUM CHLORIDE CRYS ER 20 MEQ PO TBCR
40.0000 meq | EXTENDED_RELEASE_TABLET | Freq: Once | ORAL | Status: AC
  Administered 2014-11-22: 40 meq via ORAL
  Filled 2014-11-22: qty 2

## 2014-11-22 MED ORDER — IBUPROFEN 400 MG PO TABS: 600.0000 mg | ORAL_TABLET | Freq: Three times a day (TID) | ORAL | Status: DC | PRN

## 2014-11-22 MED ORDER — LORAZEPAM 1 MG PO TABS
1.0000 mg | ORAL_TABLET | Freq: Three times a day (TID) | ORAL | Status: DC | PRN
  Administered 2014-11-22: 1 mg via ORAL
  Filled 2014-11-22 (×2): qty 1

## 2014-11-22 MED ORDER — ACETAMINOPHEN 325 MG PO TABS: 650.0000 mg | ORAL_TABLET | ORAL | Status: DC | PRN

## 2014-11-22 NOTE — ED Notes (Addendum)
PT GIVEN ATIVAN AS ORDERED FOR C/O ANXIETY AND VH - "SEEING SHADOWS MOVING". TELEPSYCH ORDERED FOR MED RECOMMENDATION FOR VH AS PER DR BELFI.

## 2014-11-22 NOTE — ED Notes (Signed)
[facility] CALLED AND ADVISED MAY HAVE DISCHARGES THIS PM SO MAY BE ABLE TO ACCEPT PT. COPY OF IVC FAXED TO [facility] - FAX #[phone]

## 2014-11-22 NOTE — BH Assessment (Signed)
[facility] who [facility] on [facility]. She reported that due to pt's psychosis he will need to be IVC if accepted to the facility.

## 2014-11-22 NOTE — ED Notes (Signed)
PT STATES HE IS NOT ALWAYS ABLE TO CONTROL HIMSELF WHEN THE AH'S OCCUR. STATES HE DOES NOT WANT TO INTENTIONALLY HURT ANYONE BUT MIGHT IF HE IS NOT ABLE TO CONTROL THE VOICES. PT NOTED TO BE CALMER AT THIS TIME. ADVISED PT STAFF APPRECIATES HIS OPEN COMMUNICATION. VOICES UNDERSTANDING.

## 2014-11-22 NOTE — ED Notes (Signed)
Telepsych being performed. 

## 2014-11-22 NOTE — ED Notes (Signed)
Pt oriented to unit/rules in POD C. [employer] to remain at bedside. Copy of IVC papers placed in medical records.

## 2014-11-22 NOTE — ED Provider Notes (Signed)
Filed Vitals:   11/22/14 1257  BP: 122/78  Pulse: 99  Temp: 97.4 F (36.3 C)  Resp: 20   telepsych done due to pt's agitated behavior.  They recommend zydis 10mg  now and then 5 mg ODT every 8 hours as needed for agitation.  Orders placed  Rolan BuccoMelanie Makayela Secrest, MD 11/22/14 1616

## 2014-11-22 NOTE — ED Notes (Signed)
[facility], per report pt has been aggressive toward staff and attempted to elope facility after admitting to suicidal ideation. Pt has also attempted to hit his head on hard surfaces to harm self. Arrives to unit with [employer] escort and male sitter.

## 2014-11-22 NOTE — [facility] Counselor (Signed)
[facility] requesting telepsych consult for pt. Writer informed Claudette Headonrad Withrow NP and he say he can do the teleassessment at approx 3 pm. Clinical research associateWriter notified pt's RN Devon EnergyBecky.  Evette Cristalaroline Paige Beatrice Sehgal, [place] Therapeutic Triage Specialist
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
## 2014-11-22 NOTE — ED Notes (Signed)
[facility], no adult beds at this time.

## 2014-11-22 NOTE — ED Notes (Signed)
Pt sleeping, geodon effective.

## 2014-11-22 NOTE — ED Notes (Signed)
STATES IS SUPPOSED TO BE SEEING A PSYCH AND IS SUPPOSED TO BE ON MEDS BUT IS NOT TAKING. STATES LIVES ALONE, HAS FAMILY/FRIENDS IN AREA. PT NOTED TO BE COOPERATIVE, CALM THIS AM.

## 2014-11-22 NOTE — Progress Notes (Signed)
Disposition CSW faxed referrals to the following facilities for inpatient psych placement:  [facility]  CSW is available as needed to assist with patient's disposition needs.  Seward SpeckLeo Shylo Zamor [facility],LCAS Behavioral Health Disposition CSW [phone]

## 2014-11-22 NOTE — ED Notes (Addendum)
Gareth EagleT'S BROTHER, MICHAEL, [phone].

## 2014-11-22 NOTE — ED Notes (Signed)
Pt continues to be calm and cooperative d/t geodon. No attempts to run in last several hours.

## 2014-11-22 NOTE — BHH Counselor (Signed)
TTS counselor faxed referrals to the following facilities in an effort to obtain inpt placement:  [facility]

## 2014-11-22 NOTE — ED Provider Notes (Signed)
CSN: [id number]     Arrival date & time 11/21/14  2047 History   First MD Initiated Contact with Patient 11/21/14 2052     Chief Complaint  Patient presents with  . Anxiety     (Consider location/radiation/quality/duration/timing/severity/associated sxs/prior Treatment) HPI  Patient presents emergency department, chief complaint of chest pain. The patient states that he was doing cocaine earlier today and is complaining of chest pain, shortness of breath. He feels like he is having anxiety attack. He states that he is hearing and seeing things that are out to get him. He repeatedly states that he is feeling unsafe. He states that he knows that he is going to hurt himself.  He is extremely agitated and states that his mind is racing.   Past Medical History  Diagnosis Date  . Anxiety    Past Surgical History  Procedure Laterality Date  . Tonsillectomy     History reviewed. No pertinent family history. History  Substance Use Topics  . Smoking status: Never Smoker   . Smokeless tobacco: Not on file  . Alcohol Use: Yes     Comment: social    Review of Systems  Unable to perform ROS: Psychiatric disorder      Allergies  Pollen extract and Shellfish allergy  Home Medications   Prior to Admission medications   Medication Sig Start Date End Date Taking? Authorizing Provider  Multiple Vitamin (MULTIVITAMIN WITH MINERALS) TABS tablet Take 1 tablet by mouth daily.   Yes Historical Provider, MD   BP 151/97 mmHg  Pulse 104  Temp(Src) 98.1 F (36.7 C) (Oral)  Resp 31  Ht 6\' 2"  (1.88 m)  Wt 208 lb (94.348 kg)  BMI 26.69 kg/m2  SpO2 100% Physical Exam  Constitutional: He is oriented to person, place, and time. He appears well-developed and well-nourished. No distress.  HENT:  Head: Normocephalic and atraumatic.  Eyes: Conjunctivae are normal. No scleral icterus.  Neck: Normal range of motion. Neck supple.  Cardiovascular: Normal rate, regular rhythm and normal heart  sounds.   Pulmonary/Chest: Effort normal and breath sounds normal. No respiratory distress. He has no wheezes. He exhibits no tenderness.  Abdominal: Soft. He exhibits no distension. There is no tenderness.  Musculoskeletal: He exhibits no edema.  Neurological: He is alert and oriented to person, place, and time.  Skin: Skin is warm and dry. He is not diaphoretic.  Psychiatric: His mood appears anxious. He is agitated and hyperactive. Thought content is paranoid. He expresses impulsivity.  Nursing note and vitals reviewed.   ED Course  Procedures (including critical care time) Labs Review Labs Reviewed  COMPREHENSIVE METABOLIC PANEL - Abnormal; Notable for the following:    Sodium 133 (*)    Potassium 3.1 (*)    Chloride 97 (*)    All other components within normal limits  ACETAMINOPHEN LEVEL - Abnormal; Notable for the following:    Acetaminophen (Tylenol), Serum <10 (*)    All other components within normal limits  CBC  SALICYLATE LEVEL  URINE RAPID DRUG SCREEN (HOSP PERFORMED)  I-STAT TROPOININ, ED    Imaging Review Dg Chest 2 View  11/21/2014   CLINICAL DATA:  Anxiety, shortness of breath, and chest pain.  EXAM: CHEST  2 VIEW  COMPARISON:  None.  FINDINGS: Mild patient rotation. The cardiomediastinal contours are normal. The lungs are clear. Pulmonary vasculature is normal. No consolidation, pleural effusion, or pneumothorax. No acute osseous abnormalities are seen.  IMPRESSION: No acute pulmonary process.   Electronically Signed  By: Rubye Oaks M.D.   On: 11/21/2014 21:57     EKG Interpretation   Date/Time:  Saturday Nov 21 2014 20:53:31 EDT Ventricular Rate:  100 PR Interval:  118 QRS Duration: 84 QT Interval:  317 QTC Calculation: 409 R Axis:   16 Text Interpretation:  Sinus tachycardia no acute ST/T changes no  significant change compared to 2006 Confirmed by GOLDSTON  MD, SCOTT  ([id number]) on 11/21/2014 10:27:51 PM      MDM   Final diagnoses:  Chest pain     .patient agitated and will not sit still  He is pacing the ED and has been returned to his room by Security who guards the door.  Patient began banging his head against the wall . He admits to wanting to kill himself, AVH, cocaine abuse and paranoia Patient is under IVC and will need psych evaluation.  2 negative troponins.  IVCd Chest pain is gone after ativan and risperdal.  UA + coke and amphetamines. Mild hypokalemia repleted orally. Safe for psych eval. Given geodon.  Arthor Captain, PA-C 11/23/14 1410  Pricilla Loveless, MD 11/26/14 1024

## 2014-11-22 NOTE — ED Notes (Addendum)
Spok w/Emily, [facility], who advised Renata Capriceonrad, NP, [facility], of pt's [facility]/o's. Advised he will see pt in approx 15 minutes. Security standing by.
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
## 2014-11-22 NOTE — ED Notes (Signed)
TTS in room with pt and speaking with the patient.

## 2014-11-22 NOTE — ED Notes (Signed)
PT CALLED RN TO ROOM. PT C/O "SEEING THINGS MOVING AROUND A LITTLE BIT." WHEN ASKED TO DESCRIBE THESE VH - STATES SEES SHADOWS MOVING. ADVISED PT TOO EARLY FOR ATIVAN TO BE GIVEN. PT REQUESTING MED TO BE GIVEN TO HIM THAT IS "HEALTHY FOR ME".

## 2014-11-23 ENCOUNTER — Encounter (HOSPITAL_COMMUNITY): Payer: Self-pay | Admitting: *Deleted

## 2014-11-23 ENCOUNTER — Inpatient Hospital Stay (HOSPITAL_COMMUNITY)
Admission: AD | Admit: 2014-11-23 | Discharge: 2014-11-27 | DRG: 885 | Disposition: A | Discharge status: Home / Self Care | Payer: Federal, State, Local not specified - Other | Source: Intra-hospital | Attending: Psychiatry | Admitting: Psychiatry

## 2014-11-23 DIAGNOSIS — F410 Panic disorder [episodic paroxysmal anxiety] without agoraphobia: Secondary | ICD-10-CM | POA: Diagnosis present

## 2014-11-23 DIAGNOSIS — R220 Localized swelling, mass and lump, head: Secondary | ICD-10-CM | POA: Diagnosis not present

## 2014-11-23 DIAGNOSIS — R45851 Suicidal ideations: Secondary | ICD-10-CM | POA: Diagnosis present

## 2014-11-23 DIAGNOSIS — F328 Other depressive episodes: Secondary | ICD-10-CM | POA: Diagnosis not present

## 2014-11-23 DIAGNOSIS — F1020 Alcohol dependence, uncomplicated: Secondary | ICD-10-CM | POA: Diagnosis present

## 2014-11-23 DIAGNOSIS — F1220 Cannabis dependence, uncomplicated: Secondary | ICD-10-CM | POA: Diagnosis not present

## 2014-11-23 DIAGNOSIS — F1420 Cocaine dependence, uncomplicated: Secondary | ICD-10-CM | POA: Diagnosis not present

## 2014-11-23 DIAGNOSIS — F19950 Other psychoactive substance use, unspecified with psychoactive substance-induced psychotic disorder with delusions: Secondary | ICD-10-CM | POA: Diagnosis present

## 2014-11-23 DIAGNOSIS — F329 Major depressive disorder, single episode, unspecified: Secondary | ICD-10-CM | POA: Diagnosis present

## 2014-11-23 DIAGNOSIS — F1590 Other stimulant use, unspecified, uncomplicated: Secondary | ICD-10-CM | POA: Diagnosis present

## 2014-11-23 DIAGNOSIS — E876 Hypokalemia: Secondary | ICD-10-CM | POA: Diagnosis present

## 2014-11-23 DIAGNOSIS — Z8659 Personal history of other mental and behavioral disorders: Secondary | ICD-10-CM

## 2014-11-23 MED ORDER — ACETAMINOPHEN 325 MG PO TABS
650.0000 mg | ORAL_TABLET | Freq: Four times a day (QID) | ORAL | Status: DC | PRN
Start: 2014-11-23 — End: 2014-11-28

## 2014-11-23 MED ORDER — TRAZODONE HCL 50 MG PO TABS
50.0000 mg | ORAL_TABLET | Freq: Every evening | ORAL | Status: DC | PRN
  Filled 2014-11-23 (×2): qty 1

## 2014-11-23 MED ORDER — NICOTINE 21 MG/24HR TD PT24
21.0000 mg | MEDICATED_PATCH | Freq: Every day | TRANSDERMAL | Status: DC
  Filled 2014-11-23: qty 1

## 2014-11-23 MED ORDER — HYDROXYZINE HCL 25 MG PO TABS
25.0000 mg | ORAL_TABLET | ORAL | Status: DC | PRN
  Administered 2014-11-23 – 2014-11-24 (×2): 25 mg via ORAL
  Filled 2014-11-23 (×2): qty 1

## 2014-11-23 MED ORDER — ALUM & MAG HYDROXIDE-SIMETH 200-200-20 MG/5ML PO SUSP: 30.0000 mL | ORAL | Status: DC | PRN

## 2014-11-23 MED ORDER — POTASSIUM CHLORIDE CRYS ER 20 MEQ PO TBCR
20.0000 meq | EXTENDED_RELEASE_TABLET | Freq: Two times a day (BID) | ORAL | Status: DC
  Administered 2014-11-23 – 2014-11-25 (×4): 20 meq via ORAL
  Filled 2014-11-23 (×9): qty 1

## 2014-11-23 MED ORDER — MAGNESIUM HYDROXIDE 400 MG/5ML PO SUSP: 30.0000 mL | Freq: Every day | ORAL | Status: DC | PRN

## 2014-11-23 NOTE — ED Notes (Signed)
[facility]

## 2014-11-23 NOTE — Progress Notes (Signed)
[facility] by Dr. Elna BreslowEappen, [facility]ina[facility]. Pt is under IVC. Can be transported when ready. Report [phone].  Spoke with [facility] RN re: pt's placement.  Ilean SkillMeghan Kayonna Lawniczak, MSW, LCSWA Clinical Social Work, Disposition  11/23/2014 [phone]

## 2014-11-23 NOTE — Tx Team (Signed)
Initial Interdisciplinary Treatment Plan   PATIENT STRESSORS: Medication change or noncompliance Substance abuse   PATIENT STRENGTHS: Ability for insight Capable of independent living Communication skills Supportive family/friends   PROBLEM LIST: Problem List/Patient Goals Date to be addressed Date deferred Reason deferred Estimated date of resolution  Safety 11/23/2014     Paranoia 11/23/2014     Substance abuse 11/23/2014     Anxiety 11/23/2014     "Get me on the right track keeping my mental wellbeing." 11/23/2014     "To feel safe." 11/23/2014                        DISCHARGE CRITERIA:  Improved stabilization in mood, thinking, and/or behavior Verbal commitment to aftercare and medication compliance Withdrawal symptoms are absent or subacute and managed without 24-hour nursing intervention  PRELIMINARY DISCHARGE PLAN: Attend 12-step recovery group Return to previous living arrangement Return to previous work or school arrangements  PATIENT/FAMIILY INVOLVEMENT: This treatment plan has been presented to and reviewed with the patient, Aaron Townsend.  The patient and family have been given the opportunity to ask questions and make suggestions.  Aaron Townsend, Aaron Townsend 11/23/2014, 5:36 PM

## 2014-11-23 NOTE — ED Notes (Signed)
Meal tray ordered 

## 2014-11-23 NOTE — ED Notes (Signed)
[facility]. Will call for report.

## 2014-11-23 NOTE — ED Notes (Signed)
Attempted report [facility] 
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
## 2014-11-23 NOTE — Progress Notes (Signed)
CSW seeking inpt placement for pt.  [facility]- per [facility] [place]- [facility]- per [facility] (for waitlist) [facility]- per Alex Gardener[facility] [place]- per Elijah Birkom  At capacity for adults: [facility]- per Jeraldine [facility]- per Agustin Creearlene (gero beds open only) [facility]- gero beds only [facility]- child/adolescent beds only  No response: [facility]  [facility]- per Wandra Mannanameka d/t behavioral acuity  Ilean SkillMeghan Jaqwon Manfred, MSW, LCSWA Clinical Social Work, Disposition  11/23/2014 [phone]

## 2014-11-23 NOTE — Progress Notes (Signed)
LCSW met with patient with regards to treatment plan. Patient under an IVC and will be transported by [facility] when bed available at [facility].  No bed at this time. Patient is aware.  Reports he just wants to get his mental health under control and a plan in place so he does not have to stay or come back to the hospital. Patient declined having LCSW call family/friends as he reports he will be the one to notify.  IVC paperwork faxed to [facility] per request.  Lane Hacker, MSW Clinical Social Work: Emergency Room [phone]

## 2014-11-23 NOTE — Progress Notes (Signed)
[facility] reports [facility] is declined due to acuity- pt removed from waitlist.  CSW will continue seeking placement  Ilean SkillMeghan Yamileth Hayse, MSW, [facility]WA Clinical Social Work, Disposition  11/23/2014 [phone]

## 2014-11-24 ENCOUNTER — Encounter (HOSPITAL_COMMUNITY): Payer: Self-pay | Admitting: Psychiatry

## 2014-11-24 DIAGNOSIS — F1590 Other stimulant use, unspecified, uncomplicated: Secondary | ICD-10-CM | POA: Diagnosis present

## 2014-11-24 DIAGNOSIS — E876 Hypokalemia: Secondary | ICD-10-CM | POA: Diagnosis present

## 2014-11-24 DIAGNOSIS — F1420 Cocaine dependence, uncomplicated: Secondary | ICD-10-CM

## 2014-11-24 DIAGNOSIS — Z8659 Personal history of other mental and behavioral disorders: Secondary | ICD-10-CM

## 2014-11-24 DIAGNOSIS — F19950 Other psychoactive substance use, unspecified with psychoactive substance-induced psychotic disorder with delusions: Secondary | ICD-10-CM | POA: Diagnosis present

## 2014-11-24 DIAGNOSIS — F410 Panic disorder [episodic paroxysmal anxiety] without agoraphobia: Secondary | ICD-10-CM

## 2014-11-24 DIAGNOSIS — F1020 Alcohol dependence, uncomplicated: Secondary | ICD-10-CM | POA: Diagnosis present

## 2014-11-24 DIAGNOSIS — F1220 Cannabis dependence, uncomplicated: Secondary | ICD-10-CM

## 2014-11-24 MED ORDER — HALOPERIDOL 5 MG PO TABS
5.0000 mg | ORAL_TABLET | Freq: Four times a day (QID) | ORAL | Status: DC | PRN
  Administered 2014-11-24: 5 mg via ORAL

## 2014-11-24 MED ORDER — OLANZAPINE 2.5 MG PO TABS
2.5000 mg | ORAL_TABLET | Freq: Every day | ORAL | Status: DC
  Administered 2014-11-24: 2.5 mg via ORAL
  Filled 2014-11-24 (×4): qty 1

## 2014-11-24 MED ORDER — HYDROXYZINE HCL 50 MG PO TABS
50.0000 mg | ORAL_TABLET | ORAL | Status: DC | PRN
  Administered 2014-11-25 – 2014-11-26 (×2): 50 mg via ORAL
  Filled 2014-11-24 (×2): qty 1
  Filled 2014-11-24: qty 20

## 2014-11-24 MED ORDER — LORAZEPAM 1 MG PO TABS
ORAL_TABLET | ORAL | Status: AC
  Filled 2014-11-24: qty 1

## 2014-11-24 MED ORDER — IBUPROFEN 800 MG PO TABS
ORAL_TABLET | ORAL | Status: AC
  Administered 2014-11-24: 800 mg via ORAL
  Filled 2014-11-24: qty 1

## 2014-11-24 MED ORDER — IBUPROFEN 800 MG PO TABS
800.0000 mg | ORAL_TABLET | Freq: Four times a day (QID) | ORAL | Status: DC | PRN
  Administered 2014-11-25: 800 mg via ORAL
  Filled 2014-11-24: qty 1

## 2014-11-24 MED ORDER — HALOPERIDOL 5 MG PO TABS
ORAL_TABLET | ORAL | Status: AC
  Filled 2014-11-24: qty 1

## 2014-11-24 MED ORDER — LORAZEPAM 1 MG PO TABS
1.0000 mg | ORAL_TABLET | ORAL | Status: DC | PRN
  Administered 2014-11-24: 1 mg via ORAL
  Filled 2014-11-24: qty 1

## 2014-11-24 MED ORDER — FLUOXETINE HCL 20 MG PO CAPS
20.0000 mg | ORAL_CAPSULE | Freq: Every day | ORAL | Status: DC
  Administered 2014-11-24 – 2014-11-25 (×2): 20 mg via ORAL
  Filled 2014-11-24 (×6): qty 1

## 2014-11-24 MED ORDER — LORAZEPAM 1 MG PO TABS
2.0000 mg | ORAL_TABLET | ORAL | Status: DC | PRN
  Administered 2014-11-24: 1 mg via ORAL

## 2014-11-24 MED ORDER — HALOPERIDOL LACTATE 5 MG/ML IJ SOLN: 5.0000 mg | Freq: Four times a day (QID) | INTRAMUSCULAR | Status: DC | PRN

## 2014-11-24 NOTE — Plan of Care (Signed)
Problem: Ineffective individual coping Goal: STG: Patient will remain free from self harm Outcome: Progressing Patient remains free from self harm. 15 minute checks continued per protocol for patient safety.   Problem: Alteration in mood & ability to function due to Goal: LTG-Pt reports reduction in suicidal thoughts (Patient reports reduction in suicidal thoughts and is able to verbalize a safety plan for whenever patient is feeling suicidal)  Outcome: Progressing Patient denies having suicidal thoughts today. Goal: STG-Patient will report withdrawal symptoms Outcome: Progressing Patient denies having any withdrawal symptoms today. Pt reports anxiety, pt has hx of anxiety.
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
## 2014-11-24 NOTE — [facility] Group Notes (Signed)
[facility] Group Notes:  (Nursing/MHT/Case Management/Adjunct)  Date:  11/24/2014  Time:  0915am  Type of Therapy:  Nurse Education  Participation Level:  Did Not Attend  Participation Quality:  Did not attend  Affect:  Did not attend  Cognitive:  Did not attend  Insight:  None  Engagement in Group:  Did not attend  Modes of Intervention:  Discussion, Education and Support  Summary of Progress/Problems: Patient was unable to attend group d/t consulting with MD during group time.  Lendell CapriceGuthrie, Luciana Cammarata A 11/24/2014, 10:26 AM

## 2014-11-24 NOTE — [facility] Suicide Risk Assessment (Signed)
[facility]   Nursing information obtained from:  Patient Demographic factors:  Male, Caucasian, Living alone Current Mental Status:  NA Loss Factors:  NA Historical Factors:  Family history of mental illness or substance abuse Risk Reduction Factors:  Employed, Positive social support Total Time spent with patient: 30 minutes Principal Problem: Panic disorder Diagnosis:   Patient Active Problem List   Diagnosis Date Noted  . Panic disorder [F41.0] 11/24/2014  . Cocaine use disorder, moderate, dependence [F14.20] 11/24/2014  . Cannabis use disorder, moderate, dependence [F12.20] 11/24/2014  . Stimulant use disorder [F15.99] 11/24/2014     Continued Clinical Symptoms:  Alcohol Use Disorder Identification Test Final Score (AUDIT): 6 The "Alcohol Use Disorders Identification Test", Guidelines for Use in Primary Care, Second Edition.  World Science writerHealth Organization [facility](WHO). Score between 0-7:  no or low risk or alcohol related problems. Score between 8-15:  moderate risk of alcohol related problems. Score between 16-19:  high risk of alcohol related problems. Score 20 or above:  warrants further diagnostic evaluation for alcohol dependence and treatment.   CLINICAL FACTORS:   Alcohol/Substance Abuse/Dependencies Previous Psychiatric Diagnoses and Treatments   Musculoskeletal: Strength & Muscle Tone: within normal limits Gait & Station: normal Patient leans: N/A  Psychiatric Specialty Exam: Physical Exam  ROS  Blood pressure 112/62, pulse 75, temperature 97.4 F (36.3 C), temperature source Oral, resp. rate 16, height 6\' 2"  (1.88 m), weight 95.255 kg (210 lb).Body mass index is 26.95 kg/(m^2).                  Please see H&P.                                        COGNITIVE FEATURES THAT CONTRIBUTE TO RISK:  Closed-mindedness, Polarized thinking and Thought constriction (tunnel vision)    SUICIDE RISK:   Moderate:  Frequent  suicidal ideation with limited intensity, and duration, some specificity in terms of plans, no associated intent, good self-control, limited dysphoria/symptomatology, some risk factors present, and identifiable protective factors, including available and accessible social support.  PLAN OF CARE: Please see H&P.   Medical Decision Making:  Review or order clinical lab tests (1), Established Problem, Worsening (2), Review of Last Therapy Session (1), Independent Review of image, tracing or specimen (2), Review of Medication Regimen & Side Effects (2) and Review of New Medication or Change in Dosage (2)  I certify that inpatient services furnished can reasonably be expected to improve the patient's condition.   Davis Ambrosini md 11/24/2014, 12:09 PM

## 2014-11-24 NOTE — H&P (Signed)
Psychiatric Admission Assessment Adult  Patient Identification: Aaron Townsend MRN:  [id number] Date of Evaluation:  11/24/2014 Chief Complaint:"I collapsed since I had a panic attack.'    Principal Diagnosis: Panic disorder Diagnosis:   Patient Active Problem List   Diagnosis Date Noted  . Panic disorder [F41.0] 11/24/2014  . Cocaine use disorder, moderate, dependence [F14.20] 11/24/2014  . Cannabis use disorder, moderate, dependence [F12.20] 11/24/2014  . Stimulant use disorder [F15.99] 11/24/2014  . History of ADHD [Z86.59] 11/24/2014  . 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 Alcohol use disorder, moderate, dependence [F10.20] 11/24/2014  . Hypokalemia [E87.6] 11/24/2014       History of Present Illness:: Aaron Townsend is a 37 y old CM who is employed as a Training and development officer at [employer] , single , lives by self in [place] , presented after he had a panic attack while he was at a convenient store in [place]. Patient is currently under IVC. Per initial notes in EHR - Pt was brought in with paranoia , auditory and visual hallucinations and suicidal ideation.Pt during initial assessment in ED reported crying spells, sleep issues, loss of appetite as as well as AH,paranoia. "  Patient seen this AM. Pt appeared to be calm, cooperative , much different from his presentation while in ED. Patient reported a significant hx of anxiety do , was being treated for that by a psychiatrist in [place]. However pt moved in [place] a year ago , to be closer to his brother who lives in [place] and hence has not been able to take his medications for anxiety. Pt also has no medical insurance at this time, his company wants him To wait for 6 months before he can get it.   Pt also reports a hx of ADHD , was on adderall, vyvanse, ritalin.   Pt today endorses panic attacks , when he feels his heart racing, chest pain as well as feels dizzy , almost everyday. Pt reports being depressed , but denies  sleep issues , or appetite changes .( off note - he did endorse these sx while in ED). Patient reports AH of mumbling as well as feels paranoid all the time .  Pt does endorse abusing cocaine as well as cannabis on and off . Pt has UDS positive for amphetamines - but states that cocaine could have been laced with it . However per review of EHR - pt was positive for amphetamines - x2 11/10/14- 11/21/14. Patient also drinks alcohol - 2-3 beers every day. Denies any withdrawal sx.     Elements:  Location:  anxiety, psychosis, agitation. Quality:  see above for details - pt with AH , Paranoia, panic sx- as well as depressive sx like sadness, crying spells .Marland Kitchen Severity:  severe . Timing:  past 2 weeks , worsening. Duration:  chronic witha cute exacerbation. Context:  hx of panic disorder, substance abuse - cocaine , cannabis,amphetamines. Associated Signs/Symptoms: Depression Symptoms:  depressed mood, difficulty concentrating, anxiety, panic attacks, (Hypo) Manic Symptoms:  Hallucinations, Anxiety Symptoms:  Panic Symptoms, Psychotic Symptoms:  Hallucinations: Auditory Paranoia, PTSD Symptoms: NA Total Time spent with patient: 1 hour  Past Medical History:  Past Medical History  Diagnosis Date  . Anxiety   . Schizophrenia   . Substance induced mood disorder   . Polysubstance abuse   . Panic attacks     Past Surgical History  Procedure Laterality Date  . Tonsillectomy     Family History:  Family History  Problem Relation Age of Onset  .  Mental illness Other   . Schizophrenia Other    Social History:  History  Alcohol Use  . Yes    Comment: "almost daily, 2-3 shots"     History  Drug Use  . Yes  . Special: Cocaine, Marijuana    History   Social History  . Marital Status: Single    Spouse Name: N/A  . Number of Children: N/A  . Years of Education: N/A   Social History Main Topics  . Smoking status: Never Smoker   . Smokeless tobacco: Not on file  . Alcohol  Use: Yes     Comment: "almost daily, 2-3 shots"  . Drug Use: Yes    Special: Cocaine, Marijuana  . Sexual Activity: Yes   Other Topics Concern  . None   Social History Narrative   Additional Social History:    Pain Medications:  (Pt denies) Prescriptions:  (Pt denies) Over the Counter:  (Pt denies) History of alcohol / drug use?: Yes Negative Consequences of Use:  (None reported) Withdrawal Symptoms:  (Pt denies) Name of Substance 1: cocaine 1 - Amount (size/oz): "half gram" 1 - Frequency: "rarely" Name of Substance 2: Marijuana 2 - Amount (size/oz): "gram or two a day" 2 - Frequency: "daily" Name of Substance 3: Alcohol use 3 - Amount (size/oz): "2-3 shots" 3 - Frequency: "almost daily"               Musculoskeletal: Strength & Muscle Tone: within normal limits Gait & Station: normal Patient leans: N/A  Psychiatric Specialty Exam: Physical Exam I concur with PE done in ED.  Review of Systems  Psychiatric/Behavioral: The patient is nervous/anxious.   All other systems reviewed and are negative.   Blood pressure 112/62, pulse 75, temperature 97.4 F (36.3 C), temperature source Oral, resp. rate 16, height '6\' 2"'  (1.88 m), weight 95.255 kg (210 lb).Body mass index is 26.95 kg/(m^2).  General Appearance: Fairly Groomed  Engineer, water::  Fair  Speech:  Normal Rate  Volume:  Normal  Mood:  Anxious and Depressed  Affect:  Appropriate  Thought Process:  Linear  Orientation:  Full (Time, Place, and Person)  Thought Content:  Paranoid Ideation  Suicidal Thoughts:  No  Homicidal Thoughts:  No  Memory:  Immediate;   Fair Recent;   Fair Remote;   Fair  Judgement:  Impaired  Insight:  Shallow  Psychomotor Activity:  Normal  Concentration:  Poor  Recall:  [employer] of Knowledge:Fair  Language: Fair  Akathisia:  No  Handed:  Right  AIMS (if indicated):     Assets:  Desire for Improvement Housing Vocational/Educational  ADL's:  Intact  Cognition: WNL   Sleep:  Number of Hours: 5.25   Risk to Self: Is patient at risk for suicide?: No (Pt denies having suicidal thoughts.) Risk to Others:  denies Prior Inpatient Therapy:  denies [facility]:  [facility]  Alcohol Screening: 1. How often do you have a drink containing alcohol?: 4 or more times a week 2. How many drinks containing alcohol do you have on a typical day when you are drinking?: 3 or 4 3. How often do you have six or more drinks on one occasion?: Less than monthly Preliminary Score: 2 4. How often during the last year have you found that you were not able to stop drinking once you had started?: Never 5. How often during the last year have you failed to do what was normally expected from you becasue of  drinking?: Never 6. How often during the last year have you needed a first drink in the morning to get yourself going after a heavy drinking session?: Never 7. How often during the last year have you had a feeling of guilt of remorse after drinking?: Never 8. How often during the last year have you been unable to remember what happened the night before because you had been drinking?: Never 9. Have you or someone else been injured as a result of your drinking?: No 10. Has a relative or friend or a doctor or another health worker been concerned about your drinking or suggested you cut down?: No Alcohol Use Disorder Identification Test Final Score (AUDIT): 6 Brief Intervention: Yes  Allergies:   Allergies  Allergen Reactions  . Pollen Extract Itching and Swelling    Eyes swell shut sometimes  . Shellfish Allergy Hives and Itching   Lab Results: No results found for this or any previous visit (from the past 48 hour(s)). Current Medications: Current Facility-Administered Medications  Medication Dose Route Frequency Provider Last Rate Last Dose  . acetaminophen (TYLENOL) tablet 650 mg  650 mg Oral Q6H PRN Encarnacion Slates, NP      . alum & mag hydroxide-simeth  (MAALOX/MYLANTA) 200-200-20 MG/5ML suspension 30 mL  30 mL Oral Q4H PRN Encarnacion Slates, NP      . FLUoxetine (PROZAC) capsule 20 mg  20 mg Oral Daily Desi Carby, MD   20 mg at 11/24/14 1101  . hydrOXYzine (ATARAX/VISTARIL) tablet 25 mg  25 mg Oral Q4H PRN Encarnacion Slates, NP   25 mg at 11/24/14 0630  . LORazepam (ATIVAN) tablet 1 mg  1 mg Oral Q4H PRN Jahel Wavra, MD      . magnesium hydroxide (MILK OF MAGNESIA) suspension 30 mL  30 mL Oral Daily PRN Encarnacion Slates, NP      . OLANZapine (ZYPREXA) tablet 2.5 mg  2.5 mg Oral QHS Reverie Vaquera, MD      . potassium chloride SA (K-DUR,KLOR-CON) CR tablet 20 mEq  20 mEq Oral BID Encarnacion Slates, NP   20 mEq at 11/24/14 3893  . traZODone (DESYREL) tablet 50 mg  50 mg Oral QHS PRN Encarnacion Slates, NP       PTA Medications: Prescriptions prior to admission  Medication Sig Dispense Refill Last Dose  . Multiple Vitamin (MULTIVITAMIN WITH MINERALS) TABS tablet Take 1 tablet by mouth daily.   11/21/2014 at Unknown time    Previous Psychotropic Medications: Yes ,klonopin,adderall,ritalin,vyvanse  Substance Abuse History in the last 12 months:  Yes.  cocaine,canabis,alcohol,amphetamines(?)    Consequences of Substance Abuse: Medical Consequences:  recent admission  Results for orders placed or performed during the hospital encounter of 11/21/14 (from the past 72 hour(s))  CBC     Status: None   Collection Time: 11/21/14  9:04 PM  Result Value Ref Range   WBC 7.2 4.0 - 10.5 K/uL   RBC 4.58 4.22 - 5.81 MIL/uL   Hemoglobin 15.0 13.0 - 17.0 g/dL   HCT 44.5 39.0 - 52.0 %   MCV 97.2 78.0 - 100.0 fL   MCH 32.8 26.0 - 34.0 pg   MCHC 33.7 30.0 - 36.0 g/dL   RDW 12.6 11.5 - 15.5 %   Platelets 261 150 - 400 K/uL  Comprehensive metabolic panel     Status: Abnormal   Collection Time: 11/21/14  9:04 PM  Result Value Ref Range   Sodium 133 (L) 135 - 145 mmol/L  Potassium 3.1 (L) 3.5 - 5.1 mmol/L   Chloride 97 (L) 101 - 111 mmol/L   CO2 22 22 - 32 mmol/L    Glucose, Bld 96 65 - 99 mg/dL   BUN 6 6 - 20 mg/dL   Creatinine, Ser 1.13 0.61 - 1.24 mg/dL   Calcium 9.5 8.9 - 10.3 mg/dL   Total Protein 8.1 6.5 - 8.1 g/dL   Albumin 4.6 3.5 - 5.0 g/dL   AST 23 15 - 41 U/L   ALT 20 17 - 63 U/L   Alkaline Phosphatase 40 38 - 126 U/L   Total Bilirubin 0.8 0.3 - 1.2 mg/dL   GFR calc non Af Amer >60 >60 mL/min   GFR calc Af Amer >60 >60 mL/min    Comment: (NOTE) The eGFR has been calculated using the CKD EPI equation. This calculation has not been validated in all clinical situations. eGFR's persistently <60 mL/min signify possible Chronic Kidney Disease.    Anion gap 14 5 - 15  Acetaminophen level     Status: Abnormal   Collection Time: 11/21/14  9:04 PM  Result Value Ref Range   Acetaminophen (Tylenol), Serum <10 (L) 10 - 30 ug/mL    Comment:        THERAPEUTIC CONCENTRATIONS VARY SIGNIFICANTLY. A RANGE OF 10-30 ug/mL MAY BE AN EFFECTIVE CONCENTRATION FOR MANY PATIENTS. HOWEVER, SOME ARE BEST TREATED AT CONCENTRATIONS OUTSIDE THIS RANGE. ACETAMINOPHEN CONCENTRATIONS >150 ug/mL AT 4 HOURS AFTER INGESTION AND >50 ug/mL AT 12 HOURS AFTER INGESTION ARE OFTEN ASSOCIATED WITH TOXIC REACTIONS.   Salicylate level     Status: None   Collection Time: 11/21/14  9:04 PM  Result Value Ref Range   Salicylate Lvl <7.4 2.8 - 30.0 mg/dL  I-stat troponin, ED  (not at [facility])     Status: None   Collection Time: 11/21/14  9:37 PM  Result Value Ref Range   Troponin i, poc 0.00 0.00 - 0.08 ng/mL   Comment 3            Comment: Due to the release kinetics of cTnI, a negative result within the first hours of the onset of symptoms does not rule out myocardial infarction with certainty. If myocardial infarction is still suspected, repeat the test at appropriate intervals.   Urine rapid drug screen (hosp performed)     Status: Abnormal   Collection Time: 11/21/14 11:24 PM  Result Value Ref Range   Opiates NONE DETECTED NONE DETECTED   Cocaine  POSITIVE (A) NONE DETECTED   Benzodiazepines NONE DETECTED NONE DETECTED   Amphetamines POSITIVE (A) NONE DETECTED   Tetrahydrocannabinol POSITIVE (A) NONE DETECTED   Barbiturates NONE DETECTED NONE DETECTED    Comment:        DRUG SCREEN FOR MEDICAL PURPOSES ONLY.  IF CONFIRMATION IS NEEDED FOR ANY PURPOSE, NOTIFY LAB WITHIN 5 DAYS.        LOWEST DETECTABLE LIMITS FOR URINE DRUG SCREEN Drug Class       Cutoff (ng/mL) Amphetamine      1000 Barbiturate      200 Benzodiazepine   128 Tricyclics       786 Opiates          300 Cocaine          300 THC              50   I-stat troponin, ED     Status: None   Collection Time: 11/22/14 12:21 AM  Result Value Ref Range  Troponin i, poc 0.01 0.00 - 0.08 ng/mL   Comment 3            Comment: Due to the release kinetics of cTnI, a negative result within the first hours of the onset of symptoms does not rule out myocardial infarction with certainty. If myocardial infarction is still suspected, repeat the test at appropriate intervals.     Observation Level/Precautions:  15 minute checks  Laboratory:  . Will get Hba1c,EKG,TSH,lipid panel,UDS ,CMP,CBC if not already done   Psychotherapy:  Individual and group therapy   Medications:  As below  Consultations:  Social worker  Discharge Concerns:  Stability and safety       Psychological Evaluations: No   Treatment Plan Summary: Daily contact with patient to assess and evaluate symptoms and progress in treatment and Medication management  Patient will benefit from inpatient treatment and stabilization.  Estimated length of stay is 5-7 days.  Reviewed past medical records,treatment plan.  Will start a trial of Prozac 20 mg po daily for affective sx. Will start a trial of Zyprexa 2.5 mg po qhs for psychosis. Will add Vistaril 25 mg po prn for breakthrough anxiety sx. Will start CIWA /Ativan po prn for ciwa>/=10. Continue Kdur 20 meq po BID for hypokalemia- BMP repeat tomorrow  PM. Will continue to monitor vitals ,medication compliance and treatment side effects while patient is here.  Will monitor for medical issues as well as call consult as needed.  Reviewed labs ,will order as needed.  CSW will start working on disposition.  Patient to participate in therapeutic milieu .       Medical Decision Making:  Review of Psycho-Social Stressors (1), Review or order clinical lab tests (1), Established Problem, Worsening (2), Review of Last Therapy Session (1), Review of Medication Regimen & Side Effects (2) and Review of New Medication or Change in Dosage (2)  I certify that inpatient services furnished can reasonably be expected to improve the patient's condition.   Lavada Langsam md 5/24/201612:46 PM

## 2014-11-24 NOTE — [facility] Counselor (Signed)
Adult Comprehensive Assessment  Patient ID: Aaron Townsend, male   DOB: [birth year], 34 y.o.   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  More distant from rest of family Financial / Lack of resources (include bankruptcy): Needs his next paycheck to pay bills Substance abuse: Alcohol daily  3 drinks  Cocaine socially with friends  Cannabis regularly  Living/Environment/Situation:  Living Arrangements: Alone Living conditions (as described by patient or guardian): nice How long has patient lived in current situation?: couple of months,  previous to that was staying with brother in [place] since he moved her a year ago from [place] What is atmosphere in current home: Comfortable  Family History:  Does patient have children?: Yes How many children?: 1 How is patient's relationship with their children?: 55 YO daughter who lives in [place]  with her mother  Childhood History:  By whom was/is the patient raised?: Both parents Description of patient's relationship with caregiver when they were a child: good Patient's description of current relationship with people who raised him/her: good Does patient have siblings?: Yes Number of Siblings: 2 Description of patient's current relationship with siblings: brother in [place], brother in [place] Did patient suffer any verbal/emotional/physical/sexual abuse as a child?: No Did patient suffer from severe childhood neglect?: No Has patient ever been sexually abused/assaulted/raped as an adolescent or adult?: No Was the patient ever a victim of a crime or a disaster?: No Witnessed domestic violence?: No Has patient been effected by domestic violence as an adult?: No  Education:  Highest grade of school patient has completed: graduated from [employer] ([employer]) school  Employment/Work Situation:   Employment  situation: Employed Where is patient currently employed?: [employer] How long has patient been employed?: 6 weeks Patient's job has been impacted by current illness: Yes Describe how patient's job has been impacted: overwhelming anxiety, paranoia What is the longest time patient has a held a job?: 3 years Where was the patient employed at that time?: [employer] in [place] Has patient ever been in the [employer]?: No Has patient ever served in combat?: No  Financial Resources:   Financial resources: Income from employment Does patient have a Lawyer or guardian?: No  Alcohol/Substance Abuse:   Alcohol/Substance Abuse Treatment Hx: Denies past history Has alcohol/substance abuse ever caused legal problems?: No  Social Support System:   Patient's Community Support System: Good Describe Community Support System: family Type of faith/religion: Ephriam Knuckles How does patient's faith help to cope with current illness?: "It's about a personal connection with a higher power."  Leisure/Recreation:   Leisure and Hobbies: wqtch movies  listen to music  ride mtn bike  Strengths/Needs:   What things does the patient do well?: cook In what areas does patient struggle / problems for patient: social situations  Discharge Plan:   Does patient have access to transportation?: Yes Will patient be returning to same living situation after discharge?: Yes Currently receiving community mental health services: No If no, would patient like referral for services when discharged?: Yes (What county[place]) Does patient have financial barriers related to discharge medications?: Yes Patient description of barriers related to discharge medications: No insurance  Summary/Recommendations:   Summary and Recommendations (to be completed by the evaluator): Aaron Townsend is a 34 YO single caucasian male who is admitted for anxiety, paranoia and substance abuse.  He works in [employer], which is  known for an atmosphere of substance use/abuse, and he states that his social anxiety causes problems for him in how to relate to others, so he joins them for recreation drug use.  "But the cocaine doesn't like me, and I need to say no to that."  He denies that he nees a referral to rehab, or any kind of substance abuse treatment, for that matter.  He is hoping to get stabilized here soon so that he can get back to his job.  "I have bills to pay, and I don't want to have to borrow money from my brother or ask for help."  He can benefit from crises stabilization, medication managment, therapeutic milieu and referral for services.  Aaron Gerald B. 11/24/2014

## 2014-11-24 NOTE — Tx Team (Signed)
Interdisciplinary Treatment Plan Update (Adult)  Date:  11/24/2014   Time Reviewed:  1:34 PM   Progress in Treatment: Attending groups: Yes. Participating in groups:  Yes. Taking medication as prescribed:  Yes. Tolerating medication:  Yes. Family/Significant othe contact made:  No Patient understands diagnosis:  Yes  As evidenced by seeking help with panic attacks Discussing patient identified problems/goals with staff:  Yes, see initial care plan. Medical problems stabilized or resolved:  Yes. Denies suicidal/homicidal ideation: Yes. Issues/concerns per patient self-inventory:  No. Other:  New problem(s) identified:  Discharge Plan or Barriers:  Return home, follow up outpt  Reason for Continuation of Hospitalization: Anxiety Medication stabilization Other; describe Paranoia  Comments:  Pt today endorses panic attacks , when he feels his heart racing, chest pain as well as feels dizzy , almost everyday. Pt reports being depressed , but denies sleep issues , or appetite changes .( of note - he did endorse these sx while in ED). Patient reports AH of mumbling as well as feels paranoid all the time .  Pt does endorse abusing cocaine as well as cannabis on and off . Pt has UDS positive for amphetamines - but states that cocaine could have been laced with it . However per review of EHR - pt was positive for amphetamines - x2 11/10/14- 11/21/14. Patient also drinks alcohol - 2-3 beers every day. Denies any withdrawal sx.   Zyprexa, prozac trial with PRN Vistaril  Estimated length of stay: 4-5 days  New goal(s):  Review of initial/current patient goals per problem list:     Attendees: Patient:  11/24/2014 1:34 PM   Family:   11/24/2014 1:34 PM   Physician:  Jomarie LongsSaramma Eappen, MD 11/24/2014 1:34 PM   Nursing:   Marzetta Boardhrista Dopson, RN 11/24/2014 1:34 PM   CSW:    Daryel Geraldodney Juell Radney, LCSW   11/24/2014 1:34 PM   Other:  11/24/2014 1:34 PM   Other:   11/24/2014 1:34 PM   Other:  Onnie BoerJennifer Clark, Nurse CM  11/24/2014 1:34 PM   Other:  Leisa LenzValerie Enoch, Vesta [facility]erMonarch TCT 11/24/2014 1:34 PM   Other:  Tomasita Morrowelora Sutton, P4CC  11/24/2014 1:34 PM   Other:  11/24/2014 1:34 PM   Other:  11/24/2014 1:34 PM   Other:  11/24/2014 1:34 PM   Other:  11/24/2014 1:34 PM   Other:  11/24/2014 1:34 PM   Other:   11/24/2014 1:34 PM    Scribe for Treatment Team:   Ida RogueNorth, Annel Zunker B, 11/24/2014 1:34 PM

## 2014-11-24 NOTE — [facility] Group Notes (Signed)
[facility] LCSW Group Therapy  11/24/2014 , 1:33 PM   Type of Therapy:  Group Therapy  Participation Level:  Invited.  Chose to not attend  Summary of Progress/Problems: Today's group focused on the term Diagnosis.  Participants were asked to define the term, and then pronounce whether it is a negative, positive or neutral term.  Daryel Geraldorth, Julious Langlois B 11/24/2014 , 1:33 PM
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
## 2014-11-25 DIAGNOSIS — F328 Other depressive episodes: Principal | ICD-10-CM

## 2014-11-25 DIAGNOSIS — F329 Major depressive disorder, single episode, unspecified: Secondary | ICD-10-CM | POA: Diagnosis present

## 2014-11-25 DIAGNOSIS — R220 Localized swelling, mass and lump, head: Secondary | ICD-10-CM | POA: Diagnosis not present

## 2014-11-25 LAB — BASIC METABOLIC PANEL
ANION GAP: 9 (ref 5–15)
BUN: 19 mg/dL (ref 6–20)
CALCIUM: 9.4 mg/dL (ref 8.9–10.3)
CHLORIDE: 106 mmol/L (ref 101–111)
CO2: 23 mmol/L (ref 22–32)
CREATININE: 1 mg/dL (ref 0.61–1.24)
GFR calc Af Amer: >60 mL/min (ref >60)
GLUCOSE: 98 mg/dL (ref 65–99)
POTASSIUM: 4.1 mmol/L (ref 3.5–5.1)
SODIUM: 138 mmol/L (ref 135–145)

## 2014-11-25 LAB — LIPID PANEL
Cholesterol: 179 mg/dL (ref 0–200)
HDL: 31 mg/dL — ABNORMAL LOW (ref >40)
LDL Cholesterol: 101 mg/dL — ABNORMAL HIGH (ref 0–99)
TRIGLYCERIDES: 233 mg/dL — AB (ref <150)
Total CHOL/HDL Ratio: 5.8 RATIO
VLDL: 47 mg/dL — ABNORMAL HIGH (ref 0–40)

## 2014-11-25 LAB — TSH: TSH: 0.938 u[IU]/mL (ref 0.350–4.500)

## 2014-11-25 MED ORDER — FLUOXETINE HCL 20 MG PO CAPS
40.0000 mg | ORAL_CAPSULE | Freq: Every day | ORAL | Status: DC
  Administered 2014-11-26 – 2014-11-27 (×2): 40 mg via ORAL
  Filled 2014-11-25: qty 28
  Filled 2014-11-25 (×2): qty 2
  Filled 2014-11-25: qty 28
  Filled 2014-11-25: qty 2

## 2014-11-25 MED ORDER — OLANZAPINE 7.5 MG PO TABS
7.5000 mg | ORAL_TABLET | Freq: Every day | ORAL | Status: DC
  Administered 2014-11-25: 7.5 mg via ORAL
  Filled 2014-11-25 (×2): qty 1

## 2014-11-25 MED ORDER — TRAZODONE HCL 50 MG PO TABS
25.0000 mg | ORAL_TABLET | Freq: Every evening | ORAL | Status: DC | PRN
  Administered 2014-11-25 – 2014-11-26 (×2): 25 mg via ORAL
  Filled 2014-11-25: qty 7
  Filled 2014-11-25: qty 1

## 2014-11-25 NOTE — Progress Notes (Signed)
Adult Psychoeducational Group Note  Date:  11/25/2014 Time:  9:25 PM  Group Topic/Focus:  Wrap-Up Group:   The focus of this group is to help patients review their daily goal of treatment and discuss progress on daily workbooks.  Participation Level:  Active  Participation Quality:  Appropriate  Affect:  Appropriate  Cognitive:  Appropriate  Insight: Appropriate  Engagement in Group:  Engaged  Modes of Intervention:  Discussion  Additional Comments:  Pt stated his day was up and down today. He had a lot of anxiety today. His plan for tomorrow is to get with his social worker and work on his discharge plans and what he is going to do when he leaves here.  Caswell CorwinOwen, Rochele Lueck C 11/25/2014, 9:25 PM

## 2014-11-25 NOTE — Progress Notes (Signed)
[facility] Post 1:1 Observation Documentation  For the first (8) hours following discontinuation of 1:1 precautions, a progress note entry by nursing staff should be documented at least every 2 hours, reflecting the patient's behavior, condition, mood, and conversation.  Use the progress notes for additional entries.  Time 1:1 discontinued:  2045  Patient's Behavior:  Calm. Pt attended evening wrap up group. Drinking coffee. Watching TV  Patient's Condition:  Anxious. Pt rated anxiety as 7 on a 0-10 scale.   Patient's Conversation:  Pt denies SI. Pt reports trazodone makes him drowsy in the morning requested half dose.  Aaron Townsend, Lexton Hidalgo K 11/25/2014, 8:51 PM

## 2014-11-25 NOTE — Clinical Social Work Note (Signed)
CSW attempted to meet with patient who was in bed.  CSW and patient to follow up tomorrow to work on discharge plan.

## 2014-11-25 NOTE — Progress Notes (Signed)
Recreation Therapy Notes  Date: 11/25/14 Time: 9:30am Location: [address] Hall Group Room  Group Topic: Stress Management  Goal Area(s) Addresses:  Patient will verbalize importance of using healthy stress management.  Patient will identify positive emotions associated with healthy stress management.   Intervention: Progressive Muscle Relaxation Script  Activity :  Patient will listen as LRT leads them through progressive muscle relaxation.  Patients will be lead through each muscle group from head to toe.  Education:  Stress Management, Discharge Planning.   Education Outcome: Acknowledges edcuation/In group clarification offered/Needs additional education  Clinical Observations/Feedback: Patient did not attend.  Amorita Vanrossum, LRT/CTRS         Thanos Cousineau A 11/25/2014 1:46 PM 

## 2014-11-25 NOTE — Progress Notes (Addendum)
Patient ID: Aaron Townsend, male   DOB: 1981-04-15, 34 y.o.   MRN: [id number]   Pt currently presents with a flat affect and depressed behavior. Pt reports "feeling unsafe around a bunch of people." Pt remains in room for most of the day but interacts with sitter and Clinical research associatewriter.   Pt provided with medications per providers orders. Pt's labs and vitals were monitored throughout the day. Pt supported emotionally and encouraged to express concerns and questions. Pt educated on medications. Pt monitored on a 1:1. Consults with sitter throughout the day.   Pt's safety ensured with 15 minute and environmental checks. Pt currently denies SI/HI and A/V hallucinations. Pt verbally agrees to seek staff if SI/HI or A/VH occurs and to consult with staff before acting on these thoughts. Pt reports "Within the last 90 days, since I smoked a bowl, I have been really paranoid that bad things will happen to me and others. Before I could tell myself it was irrational and now I can't." Pt remains on a 1:1 for another 24 hours, due to feelings of anxiety and history of panic attacks. To be reassessed tomorrow.
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
## 2014-11-25 NOTE — Progress Notes (Addendum)
[facility] Post 1:1 Observation Documentation  For the first (8) hours following discontinuation of 1:1 precautions, a progress note entry by nursing staff should be documented at least every 2 hours, reflecting the patient's behavior, condition, mood, and conversation.  Use the progress notes for additional entries.  Time 1:1 discontinued:  2045  Patient's Behavior:  In bed awake  Patient's Condition:  Calm and resting  Patient's Conversation:  Pt denies pain, report anxiety is 6.  JEHU-APPIAH, Oletha Tolson K 11/25/2014, 10:51 PM

## 2014-11-25 NOTE — Progress Notes (Signed)
Patient ID: Aaron Townsend, male   DOB: 16-Jun-1981, 34 y.o.   MRN: [id number]  1:1 Note:   D: Pt in dayroom eating dinner. Pt seen smiling while talking with sitter and Clinical research associatewriter about different kinds of exotic foods. Pt currently stable.   A: Pt given 1:1. Pt emotionally supported. Sitter offered break.   R: Pt seen in the milieu increasingly throughout the day. [facility]

## 2014-11-25 NOTE — Progress Notes (Signed)
Patient ID: Aaron Townsend, male   DOB: Feb 20, 1981, 34 y.o.   MRN: [id number]  1:1 Note:  D: Pt was laying in bed with eyes closed and snoring. Pt in no current distress. Sitter at bedside.  A: Pt consulted about 1:1 order and MD instructions. Pt emotionally supported. Medications and sleep schedules discussed. Sitter near pt. Writer offered break.  R: Pt reports "I think so" when asked if he thinks his medications are making him sleep and "Yes" when asked if they are helping his anxiety. Pt reports "I only sleep for 30 minutes at a tie because people keep coming in. I sleep okay at night too." Compromise made with pt and pt will try to remain awake and work on our daily workbook after lunch. Will continue to monitor.

## 2014-11-25 NOTE — [facility] Group Notes (Signed)
[facility] LCSW Aftercare Discharge Planning Group Note  11/25/2014  8:45 AM  Participation Quality: Did Not Attend. Patient invited to participate but declined.  Tamyka Bezio, MSW, LCSWA Clinical Social Worker  [facility] [phone]    

## 2014-11-25 NOTE — Progress Notes (Signed)
Patient ID: Aaron Townsend, male   DOB: 10-Feb-1981, 34 y.o.   MRN: [id number]  1:1 Note:  D: Pt in bed, speaking with sitter. Sitter at bedside. Pt affect flat and cooperative behavior. Pt states "I think my main problem is lack of control. If I was out in public I could just leave this situation but here I cannot. I want to go to IOP and I think the best thing with me would be to be around other people who are "paraniod or think like me. I want to find a doctor who I can trust."  A: Pt given a 1:1. Pt emotionally supported. Pt given coping skills for anxiety. Daily workbook reviewed with pt. Sitter given break opportunity.   R: Pt has been seen in the dayroom and in the hallway. Pt affect is anxious but pt maintains control in milieu. Will continue to monitor.

## 2014-11-26 LAB — HEMOGLOBIN A1C
Hgb A1c MFr Bld: 5.4 % (ref 4.8–5.6)
Mean Plasma Glucose: 108 mg/dL

## 2014-11-26 MED ORDER — OLANZAPINE 5 MG PO TABS
5.0000 mg | ORAL_TABLET | Freq: Every day | ORAL | Status: DC
  Administered 2014-11-26: 5 mg via ORAL
  Filled 2014-11-26: qty 1
  Filled 2014-11-26 (×2): qty 14
  Filled 2014-11-26 (×2): qty 1

## 2014-11-26 MED ORDER — ATOMOXETINE HCL 40 MG PO CAPS
40.0000 mg | ORAL_CAPSULE | Freq: Every day | ORAL | Status: DC
  Administered 2014-11-26 – 2014-11-27 (×2): 40 mg via ORAL
  Filled 2014-11-26 (×2): qty 1
  Filled 2014-11-26 (×2): qty 14
  Filled 2014-11-26 (×3): qty 1

## 2014-11-26 NOTE — Progress Notes (Signed)
Patient ID: Aaron Townsend, male   DOB: [birth year], 34 y.o.   MRN: [id number] D: Patient stated he is doing well. Pt rated anxiety as 3 on a 0-3 scale. Pt reports he will be following up with [facility] for [facility] management. Pt reports he realized what happen to him and promised to stay away from marijuana. Pt denied SI/HI/AVH and pain. Cooperative with assessment. No acute distressed noted at this time.   A: Met with pt 1:1. Medications administered as prescribed. Writer encouraged pt to discuss feelings. Pt encouraged to come to staff with any questions or concerns.   R: Patient is safe on the unit. Pt stayed out of karaoke because he needed to think about discharge for tomorrow. He is complaint with medications and denies any adverse reaction.

## 2014-11-26 NOTE — Progress Notes (Signed)
Nutrition Education Note  RD consulted for nutrition education regarding hyperlipidemia.   Lipid Panel     Component Value Date/Time   CHOL 179 11/25/2014 0640   TRIG 233* 11/25/2014 0640   HDL 31* 11/25/2014 0640   CHOLHDL 5.8 11/25/2014 0640   VLDL 47* 11/25/2014 0640   LDLCALC 101* 11/25/2014 0640    RD provided "High Cholesterol Nutrition Therapy" handout from the Academy of Nutrition and Dietetics. Reviewed patient's dietary recall. Provided examples on ways to decrease fat intake in diet. Discouraged intake of processed foods and to trim off all visible fat. Encouraged fresh fruits and vegetables as well as whole grain sources of carbohydrates to maximize fiber intake. Pt reports he had fruit, eggs, and oatmeal this AM.Teach back method used.  Expect good compliance compliance.  Body mass index is 26.95 kg/(m^2). Pt meets criteria for overweight based on current BMI.  Current diet order is Regular, patient is eating ad lib for meals and snacks. Labs and medications reviewed. No further nutrition interventions warranted at this time. RD contact information provided. If additional nutrition issues arise, please re-consult RD.   Trenton GammonJessica Krishay Faro, RD, LDN Inpatient Clinical Dietitian Pager # [phone] After hours/weekend pager # [phone]

## 2014-11-26 NOTE — Progress Notes (Signed)
[facility] Post 1:1 Observation Documentation  For the first (8) hours following discontinuation of 1:1 precautions, a progress note entry by nursing staff should be documented at least every 2 hours, reflecting the patient's behavior, condition, mood, and conversation.  Use the progress notes for additional entries.  Time 1:1 discontinued:  2045  Patient's Behavior:  Pt asleep  Patient's Condition:  Pt asleep  Patient's Conversation:  Pt asleep  JEHU-APPIAH, Ladan Vanderzanden K 11/26/2014, 4:24 AM

## 2014-11-26 NOTE — Progress Notes (Signed)
Patient ID: Aaron Townsend, male   DOB: 04-21-1981, 34 y.o.   MRN: [id number]   Pt currently presents with a flat affect and anxious behavior. Per self inventory, pt rates depression at a 2, hopelessness 0 and anxiety 2. Pt's daily goal is to "be more social" and they intend to do so by "talk to more people." Pt reports good sleep, concentration and appetite.   Pt provided with medications per providers orders. Pt's labs and vitals were monitored throughout the day. Pt supported emotionally and encouraged to express concerns and questions. Pt educated on medications and anxiety.  Pt's safety ensured with 15 minute and environmental checks. Pt currently denies SI/HI and A/V hallucinations. Pt verbally agrees to seek staff if SI/HI or A/VH occurs and to consult with staff before acting on these thoughts. Pt to be discharged tomorrow per assessment.

## 2014-11-26 NOTE — Progress Notes (Signed)
Recreation Therapy Notes  Animal-Assisted Activity (AAA) Program Checklist/Progress Notes Patient Eligibility Criteria Checklist & Daily Group note for Rec Tx Intervention  Date: 05.26.16 Time: 2:45pm Location: [address] Dayroom   AAA/T Program Assumption of Risk Form signed by Patient/ or Parent Legal Guardian yes  Patient is free of allergies or sever asthma yes  Patient reports no fear of animals yes  Patient reports no history of cruelty to animalsyes  Patient understands his/her participation is voluntary yes  Patient washes hands before animal contact yes  Patient washes hands after animal contact yes  Behavioral Response: Attentive, engaged  Education: Charity fundraiserHand Washing, Appropriate Animal Interaction   Education Outcome: Acknowledges understanding/In group clarification offered/Needs additional education.   Clinical Observations/Feedback: Patient pet the dog and observed.   Caroll RancherMarjette Jelisha Weed, LRT/CTRS         Caroll RancherLindsay, Roddy Bellamy A 11/26/2014 4:30 PM
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
## 2014-11-26 NOTE — [facility] Group Notes (Signed)
[facility] LCSW Group Therapy  [facility] [place] 1:15 - 2:30 PM  11/26/2014   2:56 PM   Type of Therapy:  Group Therapy  Participation Level: Active  Participation Quality:  Attentive  Affect:  Appropriate  Cognitive:  Appropriate  Insight:  Developing/Improving   Engagement in Therapy:  Developing/Improving   Modes of Intervention:  Discussion, Education, Exploration, Problem-Solving, Rapport Building, Support   Summary of Progress/Problems:   Patient was attentive to speaker from the [employer] [facility] [employer] as he shared his story of dealing with mental health/substance abuse issues and overcoming it by working a recovery program.  Patient made no comment on the presentation.  Wynn BankerHodnett, Gerrell Tabet Hairston 11/26/2014  2:56 PM

## 2014-11-26 NOTE — [facility] Suicide Risk Assessment (Signed)
[facility] INPATIENT:  Family/Significant Other Suicide Prevention Education  Suicide Prevention Education:  Patient Refusal for Family/Significant Other Suicide Prevention Education: The patient Aaron Townsend has refused to provide written consent for family/significant other to be provided Family/Significant Other Suicide Prevention Education during admission and/or prior to discharge.  Physician notified.  Wynn BankerHodnett, Levis Nazir Hairston 11/26/2014, 12:27 PM

## 2014-11-26 NOTE — Plan of Care (Signed)
Problem: Alteration in mood & ability to function due to Goal: STG-Patient will attend groups Outcome: Progressing Pt reports feeling anxious, attended evening wrap up group and engage in discussion.

## 2014-11-26 NOTE — Progress Notes (Signed)
Patient ID: Aaron Townsend, male   DOB: [birth year], 34 y.o.   MRN: [id number] Adult Psychoeducational Group Note  Date:  11/26/2014 Time: 09:15am  Group Topic/Focus:  Orientation:   The focus of this group is to educate the patient on the purpose and policies of crisis stabilization and provide a format to answer questions about their admission.  The group details unit policies and expectations of patients while admitted. Self Care:   The focus of this group is to help patients understand the importance of self-care in order to improve or restore emotional, physical, spiritual, interpersonal, and financial health.  Participation Level:  Did Not Attend  Participation Quality: n/a  Affect: n/a  Cognitive: n/a  Insight: n/a  Engagement in Group: n/a  Modes of Intervention:  Discussion, Education, Orientation and Support  Additional Comments:  Pt did not attend group. Pt in bed asleep.   [name], [name] E 11/26/2014, 10:16 AM

## 2014-11-26 NOTE — Clinical Social Work Note (Signed)
CSW met with patient who advised of having a home and transportation.  He is agreeable to follow up with [facility] at discharge.

## 2014-11-26 NOTE — Plan of Care (Signed)
Problem: Alteration in mood; excessive anxiety as evidenced by: Goal: STG-Patient can identify triggers for anxiety Outcome: Progressing Pt rated anxiety as 3 on a 0-10 scale. Pt reports follow up with [facility] for medication management after d/c.

## 2014-11-26 NOTE — Progress Notes (Signed)
[facility] Post 1:1 Observation Documentation  For the first (8) hours following discontinuation of 1:1 precautions, a progress note entry by nursing staff should be documented at least every 2 hours, reflecting the patient's behavior, condition, mood, and conversation.  Use the progress notes for additional entries.  Time 1:1 discontinued:  2045  Patient's Behavior:  Pt asleep  Patient's Condition:  Pt asleep  Patient's Conversation:  Pt asleep  JEHU-APPIAH, Kate Larock K 11/26/2014, 12:51 AM

## 2014-11-27 ENCOUNTER — Encounter (HOSPITAL_COMMUNITY): Payer: Self-pay | Admitting: Registered Nurse

## 2014-11-27 MED ORDER — TRAZODONE HCL 50 MG PO TABS: 25.0000 mg | ORAL_TABLET | Freq: Every evening | ORAL | Status: AC | PRN

## 2014-11-27 MED ORDER — HYDROXYZINE HCL 50 MG PO TABS: 50.0000 mg | ORAL_TABLET | Freq: Three times a day (TID) | ORAL | Status: AC | PRN

## 2014-11-27 MED ORDER — FLUOXETINE HCL 40 MG PO CAPS: 40.0000 mg | ORAL_CAPSULE | Freq: Every day | ORAL | Status: AC

## 2014-11-27 MED ORDER — OLANZAPINE 5 MG PO TABS: 5.0000 mg | ORAL_TABLET | Freq: Every day | ORAL | Status: AC

## 2014-11-27 MED ORDER — HYDROXYZINE HCL 50 MG PO TABS
50.0000 mg | ORAL_TABLET | Freq: Three times a day (TID) | ORAL | Status: DC | PRN
  Filled 2014-11-27: qty 20

## 2014-11-27 MED ORDER — HYDROXYZINE HCL 50 MG PO TABS: 50.0000 mg | ORAL_TABLET | Freq: Three times a day (TID) | ORAL | Status: DC | PRN

## 2014-11-27 MED ORDER — ATOMOXETINE HCL 40 MG PO CAPS: 40.0000 mg | ORAL_CAPSULE | Freq: Every day | ORAL | Status: AC

## 2014-11-27 NOTE — Progress Notes (Signed)
D: Patient discharged home per MD order.  Patient received all personal belongings, prescriptions and medication samples.  He denies SI/HI/AVH.  Reviewed discharge instructions and medications with patient and he indicated understanding.  Patient understood all follow up information.  He left ambulatory for the bus station.

## 2014-11-27 NOTE — Discharge Summary (Signed)
Physician Discharge Summary Note  Patient:  Aaron Townsend is an 34 y.o., male MRN:  [id number] DOB:  09/07/1980 Patient phone:  [phone] (home)  Patient address:   [place] [address],  Total Time spent with patient: Greater than 30 minutes  Date of Admission:  11/23/2014 Date of Discharge: 11/27/2014  Reason for Admission:  Per H&P Admission:  Aaron Townsend is a 60 y old CM who is employed as a Training and development officer at [employer] , single , lives by self in [place] , presented after he had a panic attack while he was at a convenient store in [place]. Patient is currently under IVC. Per initial notes in EHR - Pt was brought in with paranoia , auditory and visual hallucinations and suicidal ideation.Pt during initial assessment in ED reported crying spells, sleep issues, loss of appetite as well as AH,paranoia. "  Patient seen this AM. Pt appeared to be calm, cooperative , much different from his presentation while in ED. Patient reported a significant hx of anxiety do , was being treated for that by a psychiatrist in [place]. However pt moved in [place] a year ago , to be closer to his brother who lives in [place] and hence has not been able to take his medications for anxiety. Pt also has no medical insurance at this time, his company wants him To wait for 6 months before he can get it.   Pt also reports a hx of ADHD , was on adderall, vyvanse, ritalin.   Pt today endorses panic attacks , when he feels his heart racing, chest pain as well as feels dizzy , almost everyday. Pt reports being depressed , but denies sleep issues , or appetite changes .( off note - he did endorse these sx while in ED). Patient reports AH of mumbling as well as feels paranoid all the time .  Pt does endorse abusing cocaine as well as cannabis on and off . Pt has UDS positive for amphetamines - but states that cocaine could have been laced with it . However per review of EHR - pt was positive for amphetamines - x2  11/10/14- 11/21/14. Patient also drinks alcohol - 2-3 beers every day. Denies any withdrawal sx.   Principal Problem: MDD (major depressive disorder), single episode with atypical features Discharge Diagnoses: Patient Active Problem List   Diagnosis Date Noted  . MDD (major depressive disorder), single episode with atypical features [F32.8] 11/25/2014  . Lip swelling [K13.0] 11/25/2014  . Panic disorder [F41.0] 11/24/2014  . Cocaine use disorder, moderate, dependence [F14.20] 11/24/2014  . Cannabis use disorder, moderate, dependence [F12.20] 11/24/2014  . Stimulant use disorder [F15.99] 11/24/2014  . History of ADHD [Z86.59] 11/24/2014  . 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 Alcohol use disorder, moderate, dependence [F10.20] 11/24/2014  . Hypokalemia [E87.6] 11/24/2014    Musculoskeletal: Strength & Muscle Tone: within normal limits Gait & Station: normal Patient leans: N/A  Psychiatric Specialty Exam:  See Suicide Risk Assessment Physical Exam  Nursing note and vitals reviewed. Constitutional: He is oriented to person, place, and time.  Neck: Normal range of motion.  Respiratory: Effort normal.  Musculoskeletal: Normal range of motion.  Neurological: He is alert and oriented to person, place, and time.    Review of Systems  Psychiatric/Behavioral: Positive for substance abuse. Negative for suicidal ideas and hallucinations. Depression: Stable. Nervous/anxious: Stable. Insomnia: Stable.   All other systems reviewed and are negative.   Blood pressure 119/79, pulse 79,  temperature 97.3 F (36.3 C), temperature source Oral, resp. rate 18, height '6\' 2"'  (1.88 m), weight 95.255 kg (210 lb).Body mass index is 26.95 kg/(m^2).  Have you used any form of tobacco in the last 30 days? (Cigarettes, Smokeless Tobacco, Cigars, and/or Pipes): No  Has this patient used any form of tobacco in the last 30 days? (Cigarettes, Smokeless Tobacco, Cigars, and/or  Pipes) No  Past Medical History:  Past Medical History  Diagnosis Date  . Anxiety   . Schizophrenia   . Substance induced mood disorder   . Polysubstance abuse   . Panic attacks     Past Surgical History  Procedure Laterality Date  . Tonsillectomy     Family History:  Family History  Problem Relation Age of Onset  . Mental illness Other   . Schizophrenia Other    Social History:  History  Alcohol Use  . Yes    Comment: "almost daily, 2-3 shots"     History  Drug Use  . Yes  . Special: Cocaine, Marijuana    History   Social History  . Marital Status: Single    Spouse Name: N/A  . Number of Children: N/A  . Years of Education: N/A   Social History Main Topics  . Smoking status: Never Smoker   . Smokeless tobacco: Not on file  . Alcohol Use: Yes     Comment: "almost daily, 2-3 shots"  . Drug Use: Yes    Special: Cocaine, Marijuana  . Sexual Activity: Yes   Other Topics Concern  . None   Social History Narrative   Risk to Self: Is patient at risk for suicide?: No (Pt denies having suicidal thoughts.) Risk to Others:   Prior Inpatient Therapy:   Prior Outpatient Therapy:    Level of Care:  OP  Hospital Course:  Aaron Townsend was admitted for MDD (major depressive disorder), single episode with atypical features and crisis management.  He was treated discharged with the medications listed below under Medication List.  Medical problems were identified and treated as needed.  Home medications were restarted as appropriate.  Improvement was monitored by observation and Aaron Townsend daily report of symptom reduction.  Emotional and mental status was monitored by daily self-inventory reports completed by Aaron Townsend and clinical staff.         Aaron Townsend was evaluated by the treatment team for stability and plans for continued recovery upon discharge.  Aaron Townsend motivation was an integral factor for scheduling further treatment.  Employment, transportation, bed  availability, health status, family support, and any pending legal issues were also considered during his hospital stay.  He was offered further treatment options upon discharge including but not limited to Residential, Intensive Outpatient, and Outpatient treatment.  Rollin Kotowski will follow up with the services as listed below under Follow Up Information.     Upon completion of this admission the patient was both mentally and medically stable for discharge denying suicidal/homicidal ideation, auditory/visual/tactile hallucinations, delusional thoughts and paranoia.      Consults:  psychiatry  Significant Diagnostic Studies:  labs: THS, Lipid panel, HgbA1c, ETOH, UDS, BMP, CBC, I-stat troponin   Discharge Vitals:   Blood pressure 119/79, pulse 79, temperature 97.3 F (36.3 C), temperature source Oral, resp. rate 18, height '6\' 2"'  (1.88 m), weight 95.255 kg (210 lb). Body mass index is 26.95 kg/(m^2). Lab Results:   Results for orders placed or performed during the hospital encounter of 11/23/14 (from the past 72 hour(s))  TSH     Status: None   Collection Time: 11/25/14  6:40 AM  Result Value Ref Range   TSH 0.938 0.350 - 4.500 uIU/mL    Comment: Performed at [facility]  Lipid panel     Status: Abnormal   Collection Time: 11/25/14  6:40 AM  Result Value Ref Range   Cholesterol 179 0 - 200 mg/dL   Triglycerides 233 (H) <150 mg/dL   HDL 31 (L) >40 mg/dL   Total CHOL/HDL Ratio 5.8 RATIO   VLDL 47 (H) 0 - 40 mg/dL   LDL Cholesterol 101 (H) 0 - 99 mg/dL    Comment:        Total Cholesterol/HDL:CHD Risk Coronary Heart Disease Risk Table                     Men   Women  1/2 Average Risk   3.4   3.3  Average Risk       5.0   4.4  2 X Average Risk   9.6   7.1  3 X Average Risk  23.4   11.0        Use the calculated Patient Ratio above and the CHD Risk Table to determine the patient's CHD Risk.        ATP III CLASSIFICATION (LDL):  <100     mg/dL   Optimal  100-129   mg/dL   Near or Above                    Optimal  130-159  mg/dL   Borderline  160-189  mg/dL   High  >190     mg/dL   Very High Performed at [facility]   Hemoglobin A1c     Status: None   Collection Time: 11/25/14  6:40 AM  Result Value Ref Range   Hgb A1c MFr Bld 5.4 4.8 - 5.6 %    Comment: (NOTE)         Pre-diabetes: 5.7 - 6.4         Diabetes: >6.4         Glycemic control for adults with diabetes: <7.0    Mean Plasma Glucose 108 mg/dL    Comment: (NOTE) Performed At: [facility] [place], [place] [id number] Lindon Romp MD WU:[id number] Performed at [facility] metabolic panel     Status: None   Collection Time: 11/25/14  6:40 AM  Result Value Ref Range   Sodium 138 135 - 145 mmol/L   Potassium 4.1 3.5 - 5.1 mmol/L   Chloride 106 101 - 111 mmol/L   CO2 23 22 - 32 mmol/L   Glucose, Bld 98 65 - 99 mg/dL   BUN 19 6 - 20 mg/dL   Creatinine, Ser 1.00 0.61 - 1.24 mg/dL   Calcium 9.4 8.9 - 10.3 mg/dL   GFR calc non Af Amer >60 >60 mL/min   GFR calc Af Amer >60 >60 mL/min    Comment: (NOTE) The eGFR has been calculated using the CKD EPI equation. This calculation has not been validated in all clinical situations. eGFR's persistently <60 mL/min signify possible Chronic Kidney Disease.    Anion gap 9 5 - 15    Comment: Performed at [facility]    Physical Findings: AIMS: Facial and Oral Movements Muscles of Facial Expression: None, normal Lips and Perioral Area: None, normal Jaw: None, normal Tongue: None,  normal,Extremity Movements Upper (arms, wrists, hands, fingers): None, normal Lower (legs, knees, ankles, toes): None, normal, Trunk Movements Neck, shoulders, hips: None, normal, Overall Severity Severity of abnormal movements (highest score from questions above): None, normal Incapacitation due to abnormal movements: None, normal Patient's awareness of abnormal movements (rate  only patient's report): No Awareness, Dental Status Current problems with teeth and/or dentures?: No Does patient usually wear dentures?: No  CIWA:  CIWA-Ar Total: 1 COWS:  COWS Total Score: 4   See Psychiatric Specialty Exam and Suicide Risk Assessment completed by Attending Physician prior to discharge.  Discharge destination:  Home  Is patient on multiple antipsychotic therapies at discharge:  Yes,   Do you recommend tapering to monotherapy for antipsychotics?  No   Has Patient had three or more failed trials of antipsychotic monotherapy by history:  No    Recommended Plan for Multiple Antipsychotic Therapies: Additional reason(s) for multiple antispychotic treatment:  For patient stabilization      Discharge Instructions    Activity as tolerated - No restrictions    Complete by:  As directed      Diet general    Complete by:  As directed      Discharge instructions    Complete by:  As directed   Take all of you medications as prescribed by your mental healthcare provider.  Report any adverse effects and reactions from your medications to your outpatient provider promptly. Do not engage in alcohol and or illegal drug use while on prescription medicines. In the event of worsening symptoms call the crisis hotline, 911, and or go to the nearest emergency department for appropriate evaluation and treatment of symptoms. Follow-up with your primary care provider for your medical issues, concerns and or health care needs.   Keep all scheduled appointments.  If you are unable to keep an appointment call to reschedule.  Let the nurse know if you will need medications before next scheduled appointment.            Medication List    TAKE these medications      Indication   atomoxetine 40 MG capsule  Commonly known as:  STRATTERA  Take 1 capsule (40 mg total) by mouth daily. For ADHD   Indication:  Attention Deficit Hyperactivity Disorder     FLUoxetine 40 MG capsule  Commonly  known as:  PROZAC  Take 1 capsule (40 mg total) by mouth daily. For depression   Indication:  Depression     hydrOXYzine 50 MG tablet  Commonly known as:  ATARAX/VISTARIL  Take 1 tablet (50 mg total) by mouth 3 (three) times daily as needed for anxiety (Sleep).   Indication:  Anxiety Neurosis     multivitamin with minerals Tabs tablet  Take 1 tablet by mouth daily.      OLANZapine 5 MG tablet  Commonly known as:  ZYPREXA  Take 1 tablet (5 mg total) by mouth at bedtime. For Mood Stabilization   Indication:  Mood Stabilization     traZODone 50 MG tablet  Commonly known as:  DESYREL  Take 0.5 tablets (25 mg total) by mouth at bedtime as needed for sleep.   Indication:  Trouble Sleeping       Follow-up Information    Follow up with [facility] On 12/01/2014.   Why:  Please go to [facility]'s walk in clinic on Tuesday, Dec 01, 2014 or any weekday between [place] for medication management/counseling   Contact information:   [address]  [place], [place]   [address]  [phone]      Follow-up recommendations:  Activity:  As tolerated Diet:  As tolerated  Comments:   Patient has been instructed to take medications as prescribed; and report adverse effects to outpatient provider.  Follow up with primary doctor for any medical issues and If symptoms recur report to nearest emergency or crisis hot line.    Total Discharge Time: Greater than 30 minutes  Signed: Earleen Newport, FNP-BC 11/27/2014, 1:52 PM

## 2014-11-27 NOTE — [facility] Group Notes (Addendum)
[facility] Dis[facility] Note   11/27/2014 10:20 AM    Participation Quality:  Appropraite  Mood/Affect:  Appropriate  Depression Rating:  1  Anxiety Rating:  1  Thoughts of Suicide:  No  Will you contract for safety?   NA  Current AVH:  No  Plan for Discharge/Comments:  Patient attended discharge planning group and actively participated in group. He reports feeling much better and ready to discharge home today and return to work tomorrow.  He will follow up with [facility].Suicide prevention education reviewed and SPE document provided.   Transportation Means: Patient has transportation.   Supports:  Patient has a support system.   Aaron Townsend, Aaron Townsend

## 2014-11-27 NOTE — Progress Notes (Signed)
Recreation Therapy Notes  Date: 05.27.16 Time: 9:30am Location: 300 Group Room  Group Topic: Stress Management  Goal Area(s) Addresses:  Patient will verbalize importance of using healthy stress management.  Patient will identify positive emotions associated with healthy stress management.   Intervention: Progressive Muscle Relaxation, Guided Imagery Script  Activity :  Progressive Muscle Relaxation & Guided Imagery.  LRT introduced and educated patients on stress management  Techniques of progressive muscle relaxation and guided imagery.  Scripts were used to deliver both techniques to patients, patients were asked to follow the script read allowed by LRT to engage in practicing both stress management techniques.  Education:  Stress Management, Discharge Planning.   Education Outcome: Acknowledges edcuation/In group clarification offered/Needs additional education  Clinical Observations/Feedback: Patient did not attend group.  Emmalyne Giacomo Lindasy, LRT/CTRS         Solace Wendorff A 11/27/2014 4:11 PM 

## 2014-11-27 NOTE — BHH Suicide Risk Assessment (Signed)
[facility] Discharge Suicide Risk Assessment   Demographic Factors:  Male and Caucasian  Total Time spent with patient: 30 minutes  Musculoskeletal: Strength & Muscle Tone: within normal limits Gait & Station: normal Patient leans: N/A  Psychiatric Specialty Exam: Physical Exam  Review of Systems  Psychiatric/Behavioral: Positive for substance abuse (STABLE).  All other systems reviewed and are negative.   Blood pressure 119/79, pulse 79, temperature 97.3 F (36.3 C), temperature source Oral, resp. rate 18, height  (1.88 m), weight 95.255 kg (210 lb).Body mass index is 26.95 kg/(m^2).  General Appearance: Casual  Eye Contact::  Fair  Speech:  Normal Rate409  Volume:  Normal  Mood:  Euthymic  Affect:  Appropriate  Thought Process:  Coherent  Orientation:  Full (Time, Place, and Person)  Thought Content:  WDL  Suicidal Thoughts:  No  Homicidal Thoughts:  No  Memory:  Immediate;   Fair Recent;   Fair Remote;   Fair  Judgement:  Fair  Insight:  Fair  Psychomotor Activity:  Normal  Concentration:  Fair  Recall:  [employer] of Knowledge:Fair  Language: Fair  Akathisia:  No  Handed:  Right  AIMS (if indicated):     Assets:  Communication Skills Desire for Improvement Physical Health Talents/Skills Vocational/Educational  Sleep:  Number of Hours: 6.75  Cognition: WNL  ADL's:  Intact   Have you used any form of tobacco in the last 30 days? (Cigarettes, Smokeless Tobacco, Cigars, and/or Pipes): No  Has this patient used any form of tobacco in the last 30 days? (Cigarettes, Smokeless Tobacco, Cigars, and/or Pipes) No  Mental Status Per Nursing Assessment::   On Admission:  NA  Current Mental Status by Physician: PT DENIES SI/HI/AH/VH  Loss Factors: RELOCATION TO [place], LACK OF HEALTH INSURANCE  Historical Factors: Impulsivity  Risk Reduction Factors:   Employed, Positive social support and Positive therapeutic relationship  Continued Clinical Symptoms:   Alcohol/Substance Abuse/Dependencies Previous Psychiatric Diagnoses and Treatments  Cognitive Features That Contribute To Risk:  Polarized thinking    Suicide Risk:  Minimal: No identifiable suicidal ideation.  Patients presenting with no risk factors but with morbid ruminations; may be classified as minimal risk based on the severity of the depressive symptoms  Principal Problem: MDD (major depressive disorder), single episode with atypical features ( IMPROVED) Discharge Diagnoses:  Patient Active Problem List   Diagnosis Date Noted  . MDD (major depressive disorder), single episode with atypical features [F32.8] 11/25/2014  . Lip swelling [K13.0] 11/25/2014  . Panic disorder [F41.0] 11/24/2014  . Cocaine use disorder, moderate, dependence [F14.20] 11/24/2014  . Cannabis use disorder, moderate, dependence [F12.20] 11/24/2014  . Stimulant use disorder [F15.99] 11/24/2014  . History of ADHD [Z86.59] 11/24/2014  . 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 Alcohol use disorder, moderate, dependence [F10.20] 11/24/2014  . Hypokalemia [E87.6] 11/24/2014    Follow-up Information    Follow up with [facility] On 12/01/2014.   Why:  Please go to [facility]'s walk in clinic on Tuesday, Dec 01, 2014 or any weekday between 8AM - 3PM for medication management/counseling   Contact information:   [address] [place], [place]   [address]  [phone]      Plan Of Care/Follow-up recommendations:  Activity:  No restrictions Diet:  regular Tests:  as needed Other:  follow up with after care  Is patient on multiple antipsychotic therapies at discharge:  No   Has Patient had three or more failed trials of antipsychotic monotherapy by history:  No  Recommended Plan for Multiple Antipsychotic Therapies: NA    Ayham Word MD 11/27/2014, 9:30 AM

## 2014-11-27 NOTE — Tx Team (Signed)
Interdisciplinary Treatment Plan Update (Adult)  Date:  11/27/2014  Time Reviewed:  10:16 AM   Progress in Treatment: Attending groups: Patient is attending groups. Participating in groups:  Patient engages in discussion Taking medication as prescribed:  Patient is taking medications Tolerating medication:  Patient is tolerating medications Family/Significant othe contact made:   No, patient declined collateral contact Patient understands diagnosis:Yes, patient understands diagnosis and need for treatment Discussing patient identified problems/goals with staff:  Yes, patient is able to express goals/problems Medical problems stabilized or resolved:  Yes Denies suicidal/homicidal ideation: Yes, patient is denying SI/HI. Issues/concerns per patient self-inventory:   Other:  Discharge Plan or Barriers:    Patient will return to his home and [facility]  Reason for Continuation of Hospitalization:  Comments:  Additional comments:  Patient and CSW reviewed Patient Discharge Process Letter/Patient Involvement Form.  Patient verbalized understanding and signed form.  Patient and CSW also reviewed and identified patient's goals and treatment plan.  Patient verbalized understanding and agreed to plan.  Estimated length of stay:  Discharge today  New goal(s):  Review of initial/current patient goals per problem list:  Please see plan of careInterdisciplinary Treatment Plan Update (Adult)  Attendees: Patient 11/27/2014 10:16 AM   Family:   11/27/2014 10:16 AM   Physician:  Kathie RhodesS. Elna BreslowEappen, MD 11/27/2014 10:16 AM   Nursing:   Robbie LouisVivian Kent, RN 11/27/2014 10:16 AM   Clinical Social Worker:  Juline PatchQuylle Aariv Medlock, LCSW 11/27/2014 10:16 AM   Clinical Social Worker:  Belenda CruiseKristin Drinkard, LCSW-A 11/27/2014 10:16 AM   Case Manager:  Onnie BoerJennifer Clark, RN 11/27/2014 10:16 AM   Other:   11/27/2014 10:16 AM  Other:   11/27/2014  10:16 AM   Other:  11/27/2014 10:16 AM   Other:  11/27/2014 10:16 AM   Other:   11/27/2014 10:16 AM   Other:  Chad CordialValerie Enoch, [facility] Transition Team Coordinator 11/27/2014 10:16 AM   Other:   11/27/2014 10:16 AM   Other:  11/27/2014 10:16 AM   Other:   11/27/2014 10:16 AM    Scribe for Treatment Team:   Wynn BankerHodnett, Abdoulie Tierce Hairston, 11/27/2014   10:16 AM

## 2014-11-27 NOTE — Progress Notes (Signed)
  [facility] Dis[facility] :  Will you be returning to the same living situation after discharge:  Yes,  Patient is returning to his home. At discharge, do you have transportation home?: Yes,  Patient will arrange transportation home. Do you have the ability to pay for your medications: No.  Patient needs assistance with indigent medications.  Release of information consent forms completed and in the chart;  Patient's signature needed at discharge.  Patient to Follow up at: Follow-up Information    Follow up with [facility] On 12/01/2014.   Why:  Please go to [facility]'s walk in clinic on Tuesday, Dec 01, 2014 or any weekday between 8AM - 3PM for medication management/counseling   Contact information:   [address] [place], [place]   [id number]  [phone]      Patient denies SI/HI: Patient no longer endorsing SI/HI or other thoughts of self harm. Safety Planning and Suicide Prevention discussed: .Reviewed with all patients during discharge planning group   Have you used any form of tobacco in the last 30 days? (Cigarettes, Smokeless Tobacco, Cigars, and/or Pipes): No  Has patient been referred to the Quitline?: No referral needed to quitline.   Wynn BankerHodnett, Felis Quillin Hairston 11/27/2014, 10:18 AM

## 2015-09-21 IMAGING — CT CT HEAD W/O CM
2 series · 17 of 30 positions shown, 20 images · non-contrast
Comparison: None.

CLINICAL DATA: Self-inflicted head trauma

EXAM:
CT HEAD WITHOUT CONTRAST
TECHNIQUE: Contiguous axial images were obtained from the base of the skull
through the vertex without intravenous contrast.

[Series 2: head w/o · axial · non-contrast · 0.45mm/px · z∈[+1419,+1549]mm · 9 of 34 slices shown, 12 images]
[im 4/34  brain]
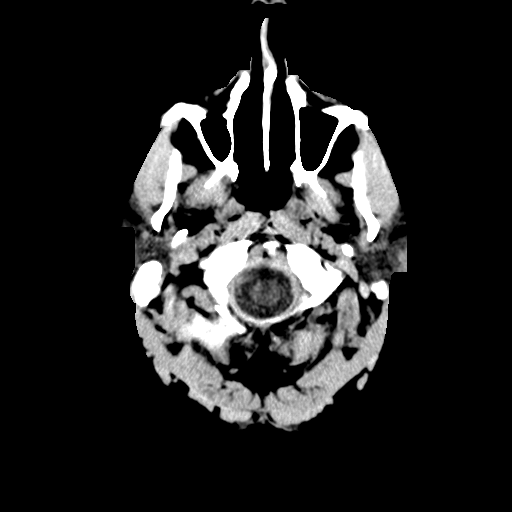
[im 4/34  bone]
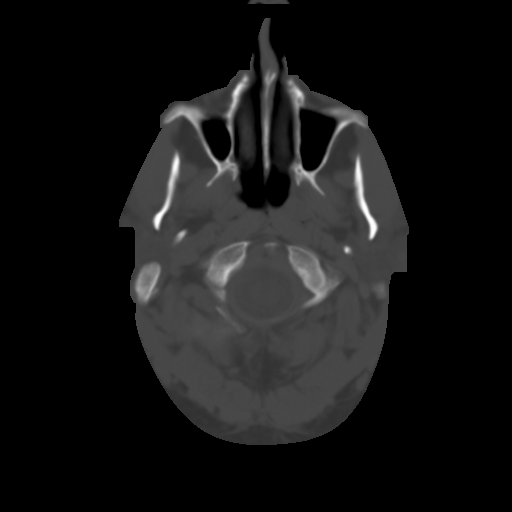
[im 7/34  brain]
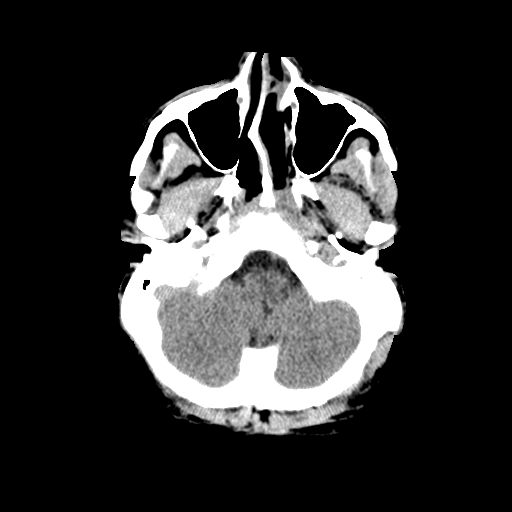
[im 10/34  brain]
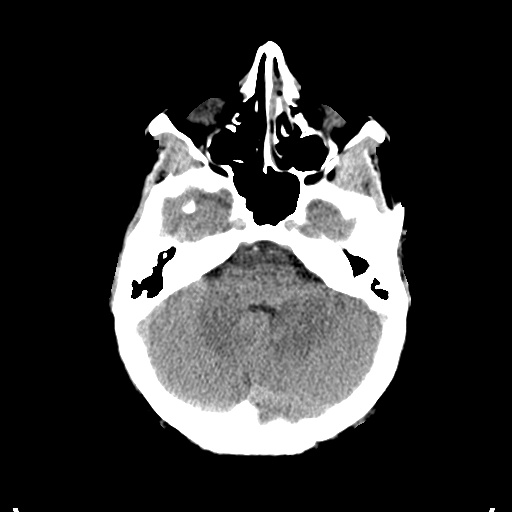
[im 14/34  brain]
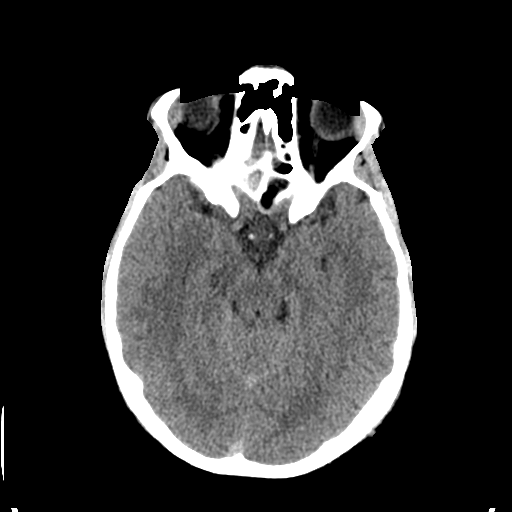
[im 17/34  brain]
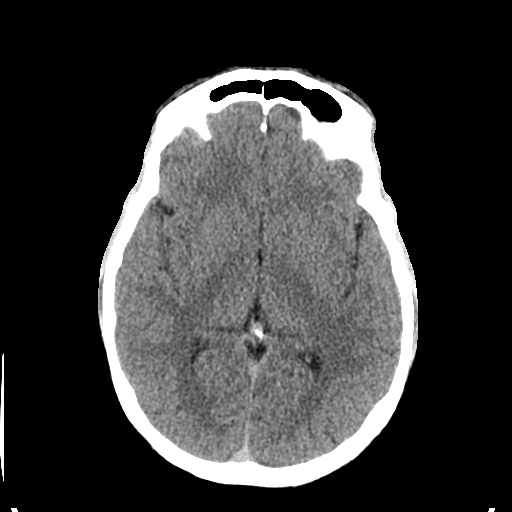
[im 17/34  bone]
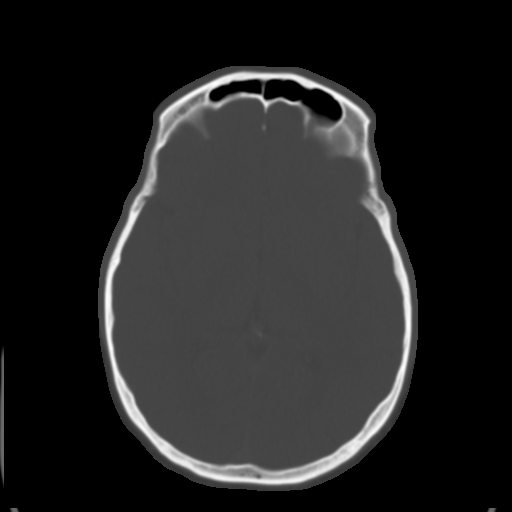
[im 20/34  brain]
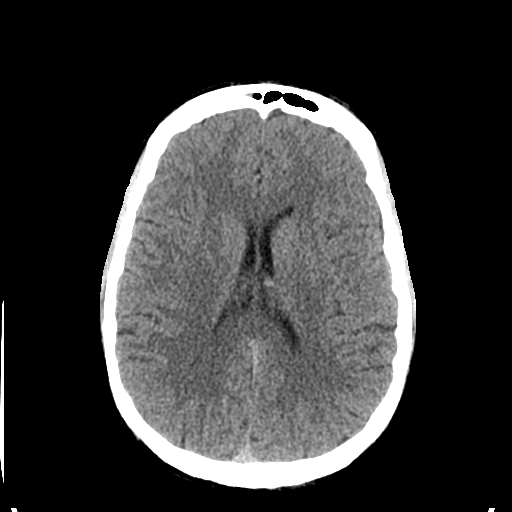
[im 24/34  brain]
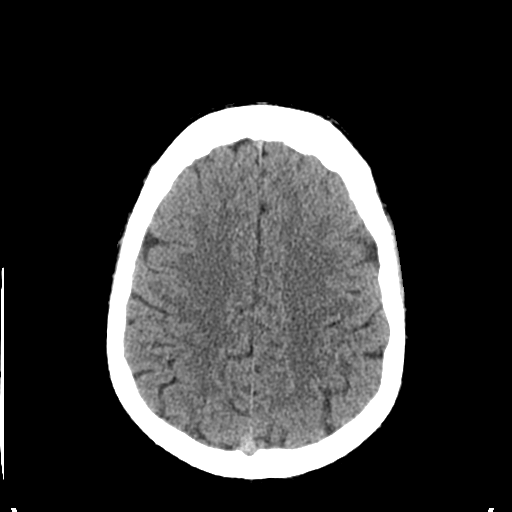
[im 27/34  brain]
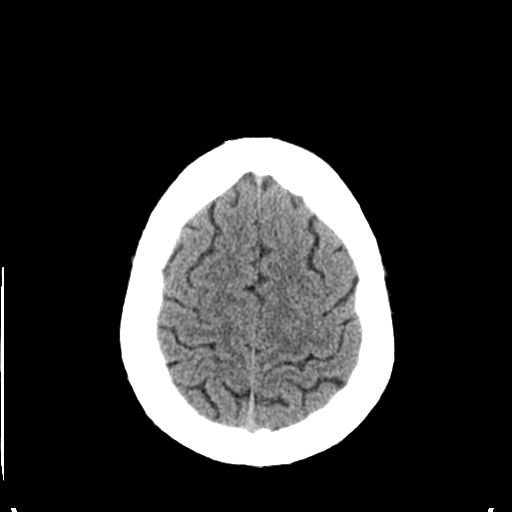
[im 30/34  brain]
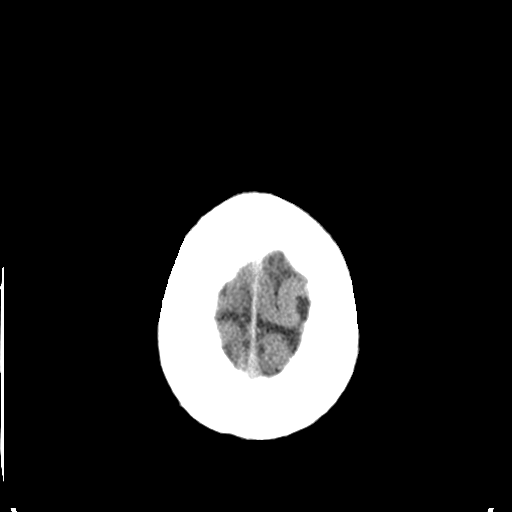
[im 30/34  bone]
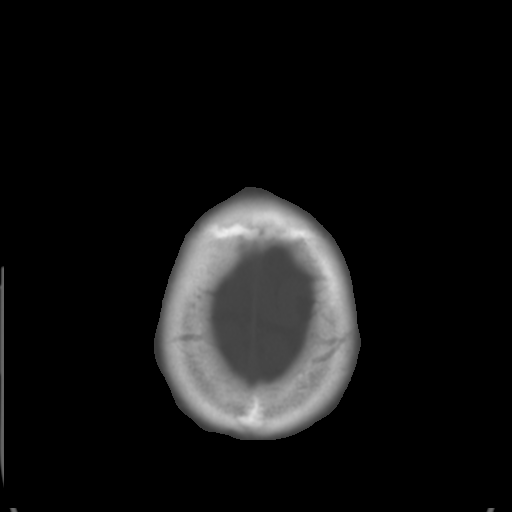

[Series 3: bone windows · axial · 0.45mm/px · z∈[+1422,+1551]mm · 8 of 57 slices shown]
[im 7/57  bone]
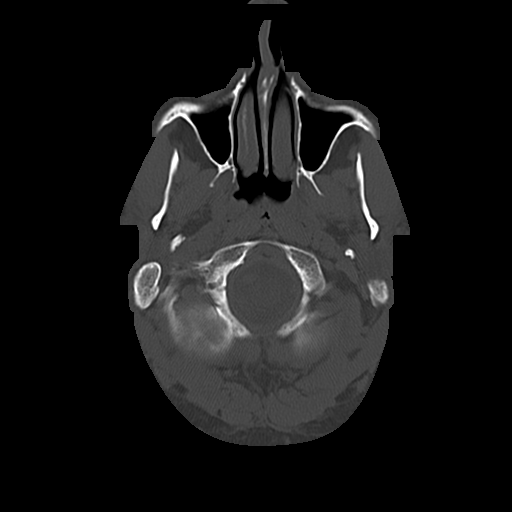
[im 13/57  bone]
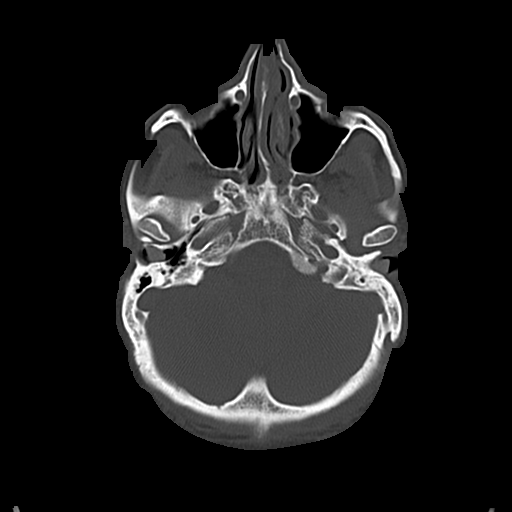
[im 19/57  bone]
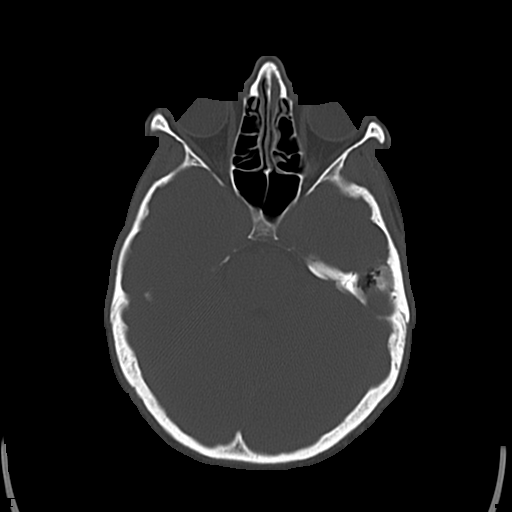
[im 25/57  bone]
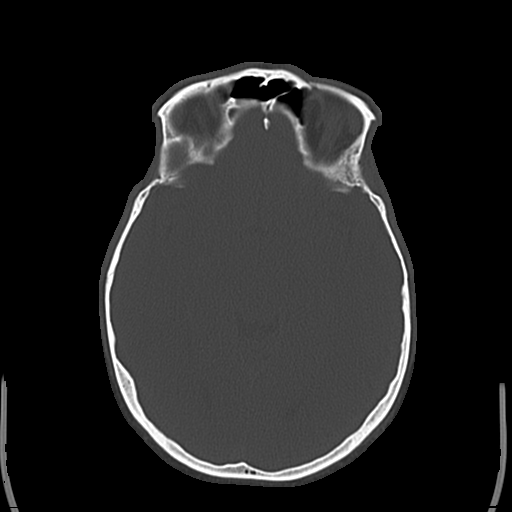
[im 32/57  bone]
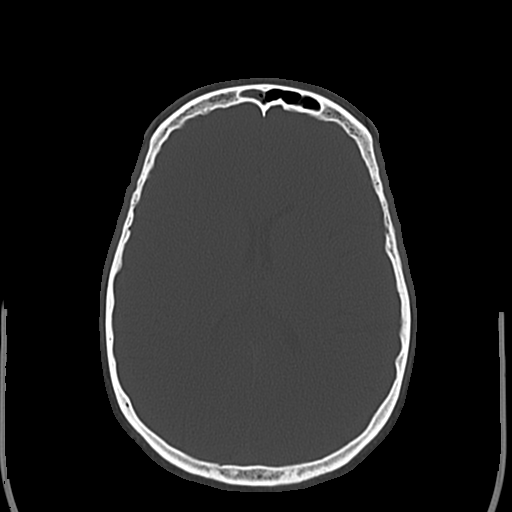
[im 38/57  bone]
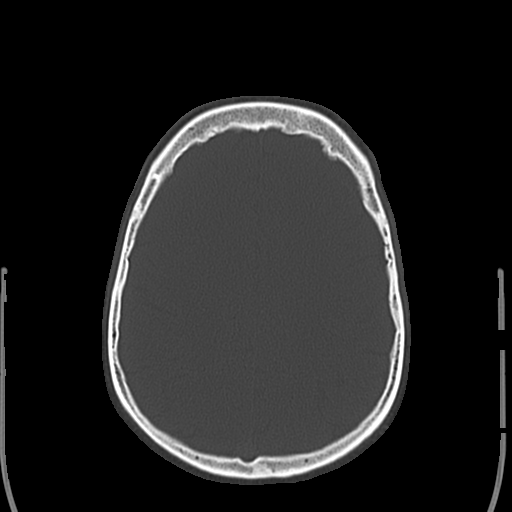
[im 44/57  bone]
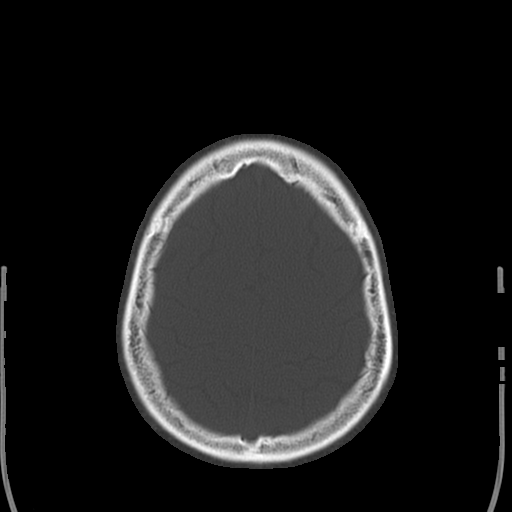
[im 50/57  bone]
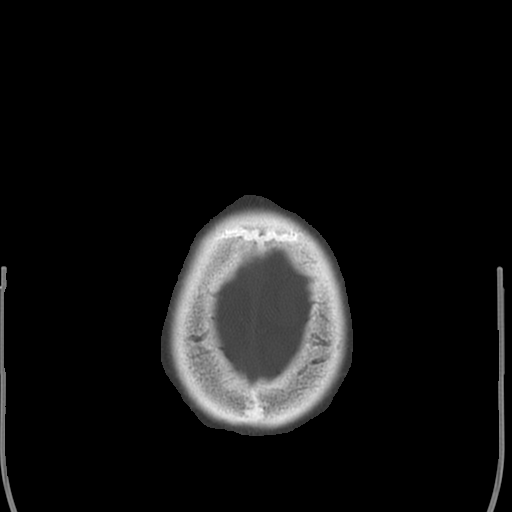

[17 of 30 positions shown; findings below may reference images not displayed]

FINDINGS: There is no intracranial hemorrhage, mass or evidence of acute
infarction. Gray matter and white matter appear normal. Brainstem
and posterior fossa are unremarkable. The ventricles and basal
cisterns appear normal.

The bony structures are intact. The visible portions of the
paranasal sinuses are clear.
IMPRESSION: Normal brain

## 2015-10-02 IMAGING — CR DG CHEST 2V
2 series · 2 of 2 positions shown · non-contrast
Comparison: None.

CLINICAL DATA: Anxiety, shortness of breath, and chest pain.

EXAM:
CHEST  2 VIEW

[w chest pa]
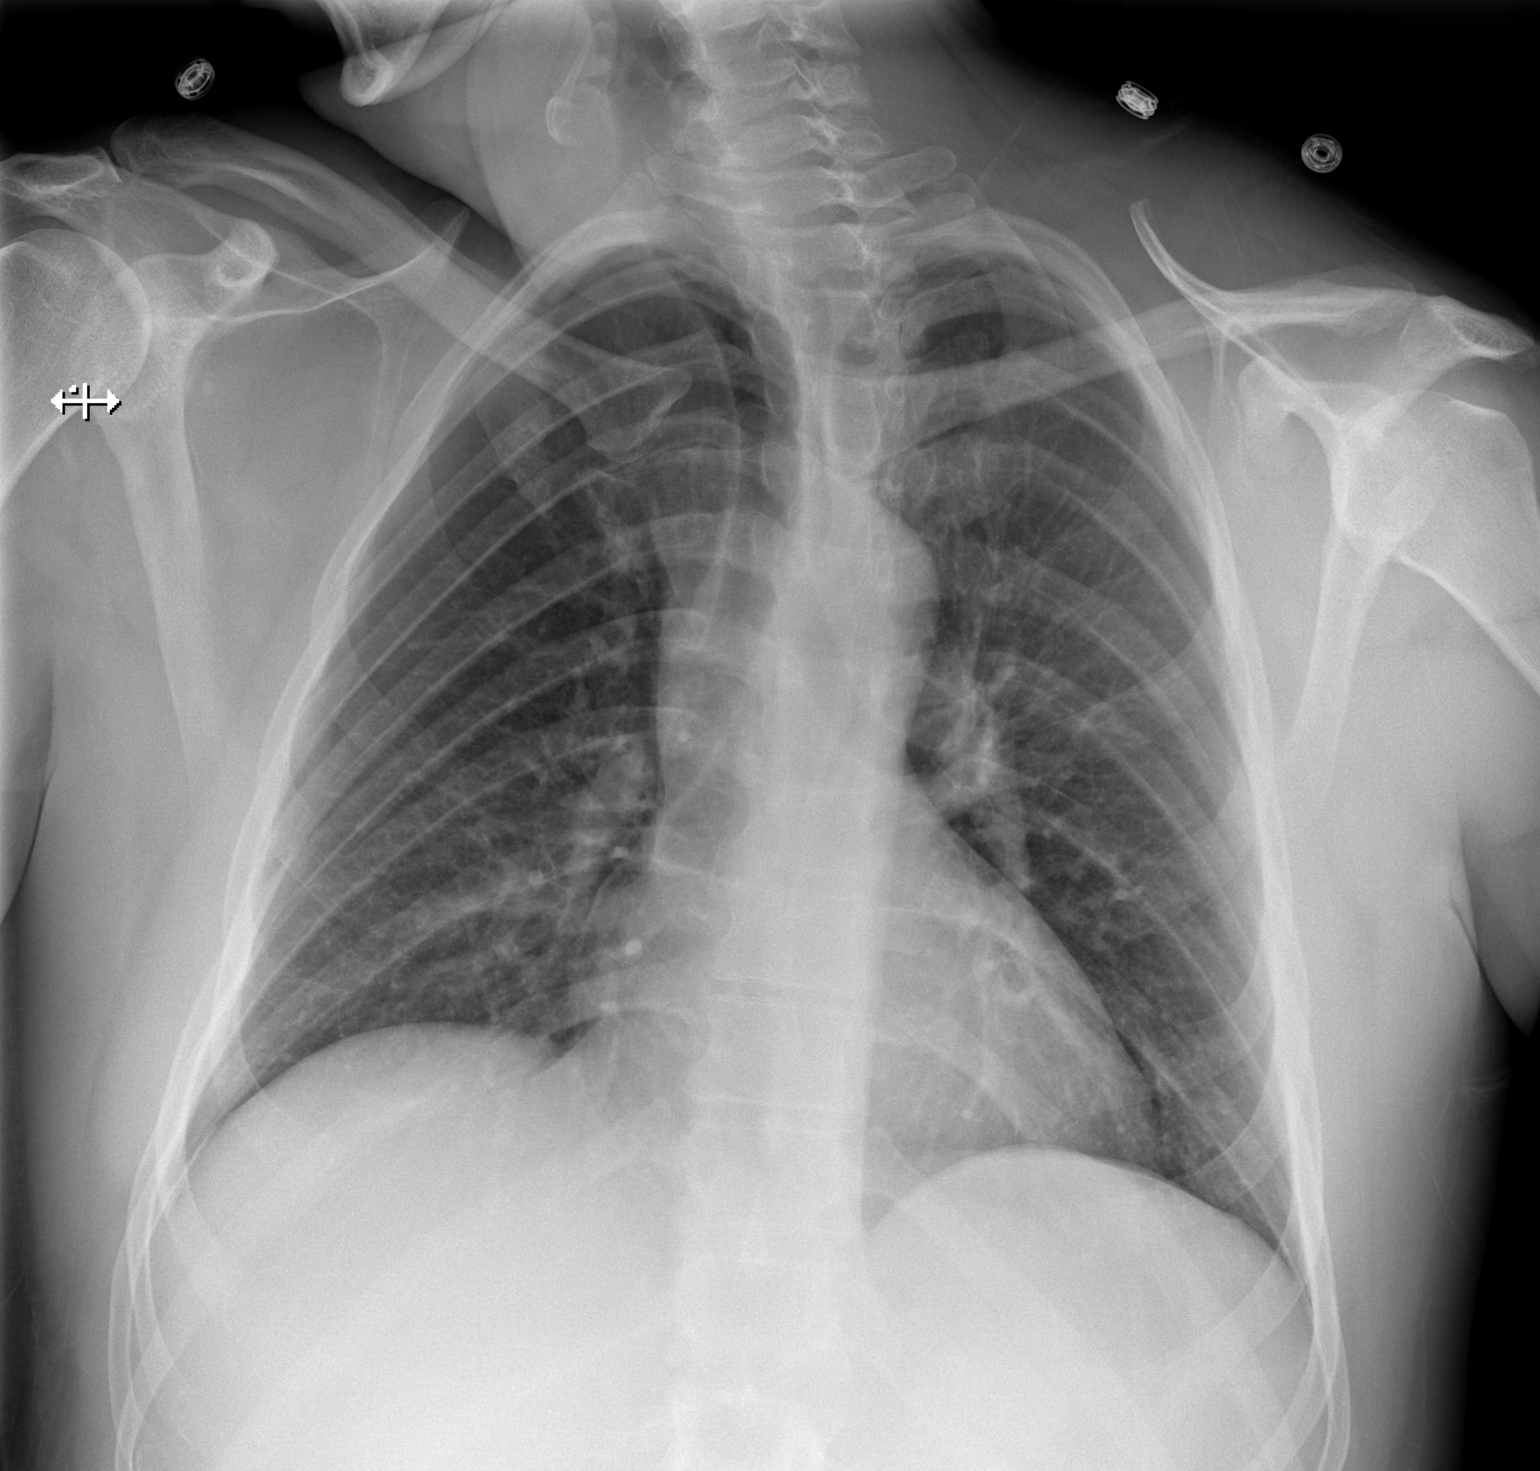

[w chest lat]
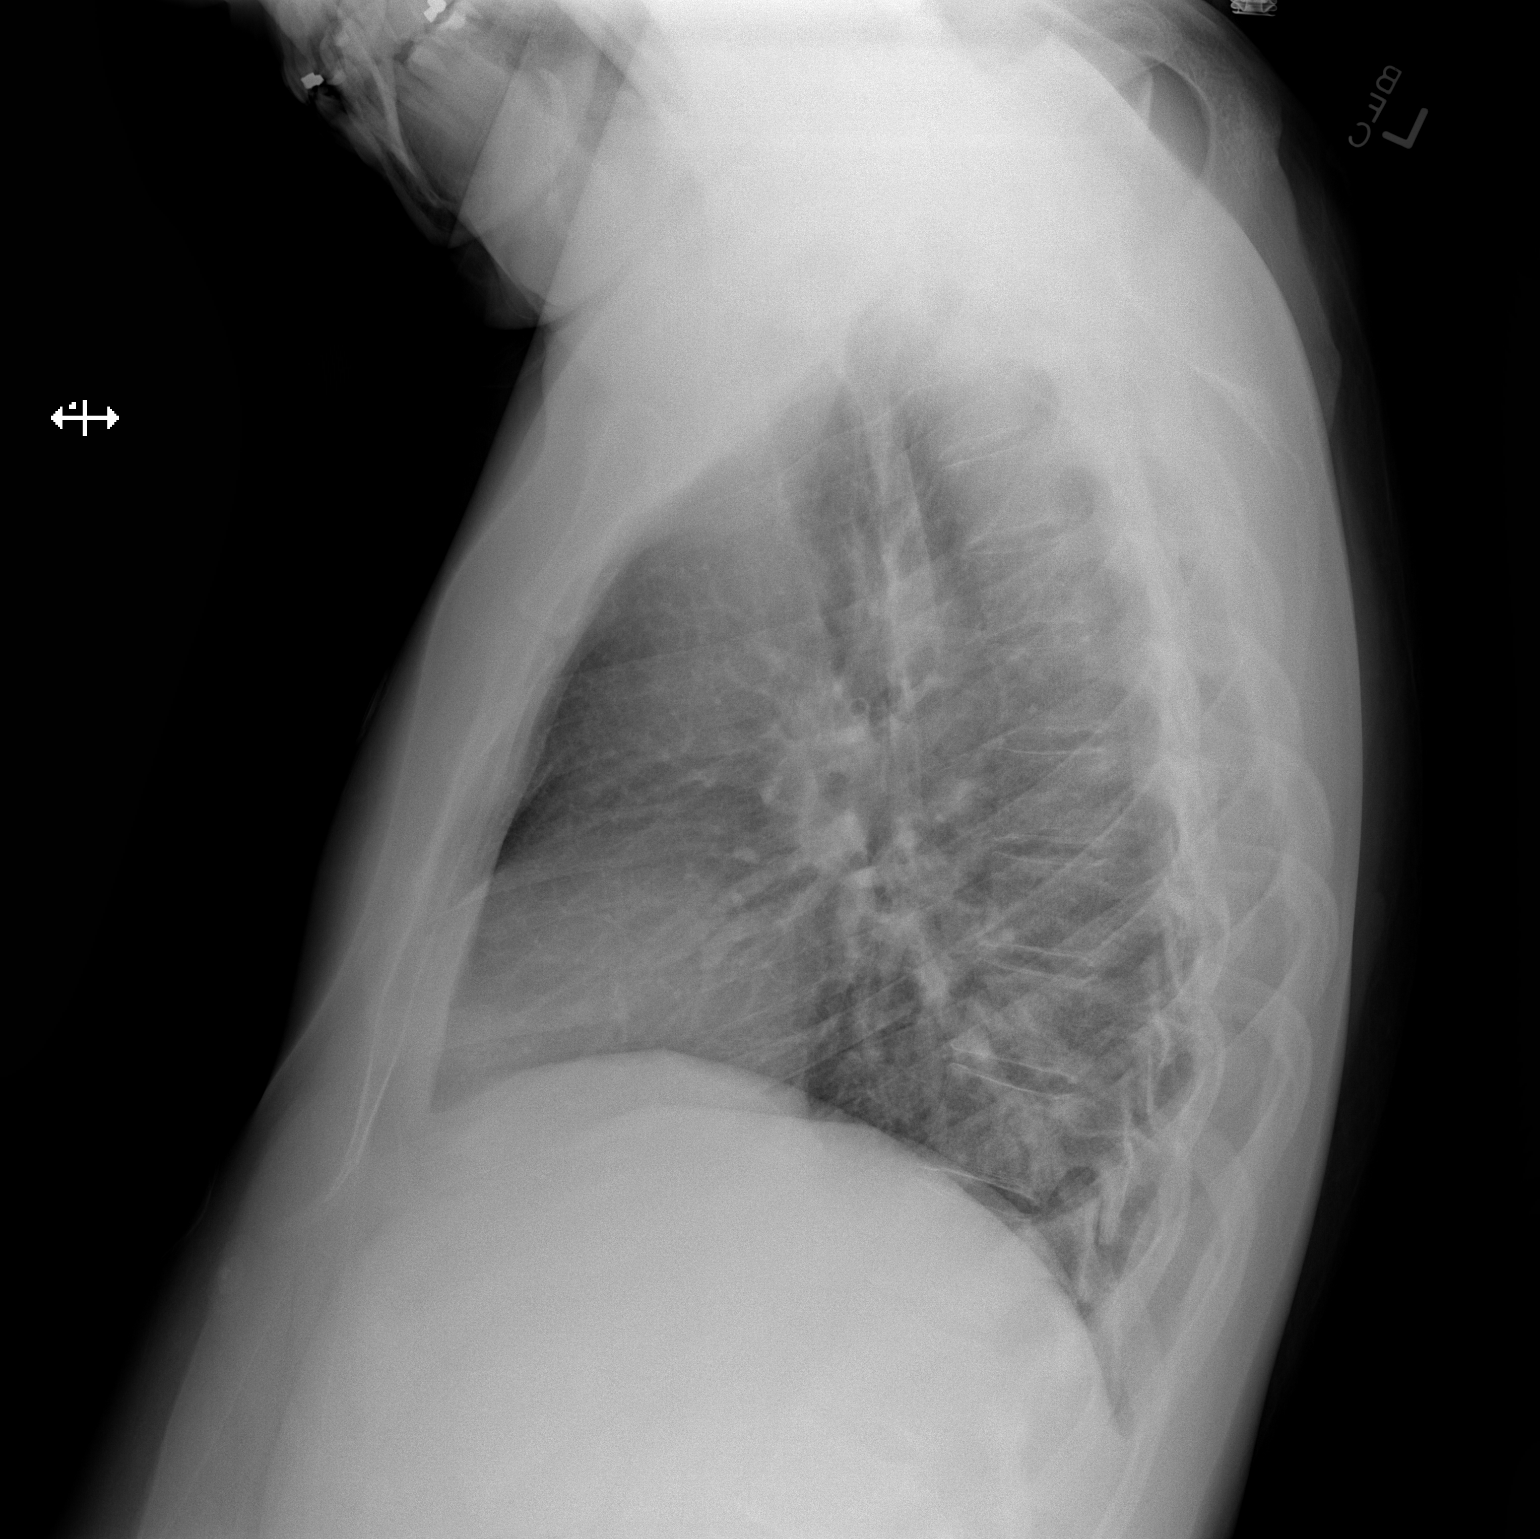

[2 of 2 positions shown; findings below may reference images not displayed]

FINDINGS: Mild patient rotation. The cardiomediastinal contours are normal.
The lungs are clear. Pulmonary vasculature is normal. No
consolidation, pleural effusion, or pneumothorax. No acute osseous
abnormalities are seen.
IMPRESSION: No acute pulmonary process.
